# Patient Record
Sex: Male | Born: 1995 | Race: White | Hispanic: No | Marital: Single | Smoking: Former smoker
Health system: Southern US, Community
[De-identification: ages and names within clinical notes are randomized; demographics above are authoritative.]

## PROBLEM LIST (undated history)

## (undated) DIAGNOSIS — F1011 Alcohol abuse, in remission: Secondary | ICD-10-CM

## (undated) DIAGNOSIS — I10 Essential (primary) hypertension: Secondary | ICD-10-CM

## (undated) HISTORY — DX: Essential (primary) hypertension: I10

## (undated) HISTORY — DX: Alcohol abuse, in remission: F10.11

---
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
## 2017-10-15 ENCOUNTER — Ambulatory Visit: Payer: Self-pay | Admitting: *Deleted

## 2017-10-15 ENCOUNTER — Encounter: Payer: Self-pay | Admitting: *Deleted

## 2017-10-15 VITALS — BP 123/87 | HR 65 | Ht 72.0 in | Wt 205.0 lb

## 2017-10-15 DIAGNOSIS — Z0000 Encounter for general adult medical examination without abnormal findings: Secondary | ICD-10-CM

## 2017-10-15 NOTE — Progress Notes (Signed)
[facility] insurance premium discount evaluation: Labs Drawn. [facility] ROI form signed. Tobacco Free Attestation form signed.  Forms placed in paper chart.  

## 2017-10-15 NOTE — Addendum Note (Signed)
Addended by: Loraine GripWORKMAN, HALEY S on: 10/15/2017 10:20 AM   Modules accepted: Orders

## 2017-10-16 LAB — CMP12+LP+TP+TSH+6AC+CBC/D/PLT
ALT: 7 IU/L (ref 0–44)
AST: 14 IU/L (ref 0–40)
Albumin/Globulin Ratio: 1.8 (ref 1.2–2.2)
Albumin: 4.6 g/dL (ref 3.5–5.5)
Alkaline Phosphatase: 67 IU/L (ref 39–117)
BUN/Creatinine Ratio: 6 — ABNORMAL LOW (ref 9–20)
BUN: 6 mg/dL (ref 6–20)
Basophils Absolute: 0.1 10*3/uL (ref 0.0–0.2)
Basos: 1 %
Bilirubin Total: 1.1 mg/dL (ref 0.0–1.2)
CALCIUM: 9.5 mg/dL (ref 8.7–10.2)
CHOL/HDL RATIO: 2.8 ratio (ref 0.0–5.0)
CREATININE: 1 mg/dL (ref 0.76–1.27)
Chloride: 105 mmol/L (ref 96–106)
Cholesterol, Total: 113 mg/dL (ref 100–169)
EOS (ABSOLUTE): 0.1 10*3/uL (ref 0.0–0.4)
Eos: 2 %
Free Thyroxine Index: 1.9 (ref 1.2–4.9)
GFR, EST AFRICAN AMERICAN: 126 mL/min/{1.73_m2} (ref >59)
GFR, EST NON AFRICAN AMERICAN: 109 mL/min/{1.73_m2} (ref >59)
GGT: 10 IU/L (ref 0–65)
GLOBULIN, TOTAL: 2.5 g/dL (ref 1.5–4.5)
GLUCOSE: 88 mg/dL (ref 65–99)
HDL: 40 mg/dL (ref >39)
Hematocrit: 46.5 % (ref 37.5–51.0)
Hemoglobin: 16.2 g/dL (ref 13.0–17.7)
Immature Grans (Abs): 0 10*3/uL (ref 0.0–0.1)
Immature Granulocytes: 0 %
Iron: 143 ug/dL (ref 38–169)
LDH: 157 IU/L (ref 121–224)
LDL Calculated: 60 mg/dL (ref 0–109)
Lymphocytes Absolute: 2.8 10*3/uL (ref 0.7–3.1)
Lymphs: 46 %
MCH: 30.6 pg (ref 26.6–33.0)
MCHC: 34.8 g/dL (ref 31.5–35.7)
MCV: 88 fL (ref 79–97)
MONOS ABS: 0.6 10*3/uL (ref 0.1–0.9)
Monocytes: 9 %
NEUTROS ABS: 2.5 10*3/uL (ref 1.4–7.0)
Neutrophils: 42 %
PHOSPHORUS: 3.5 mg/dL (ref 2.5–5.6)
POTASSIUM: 4 mmol/L (ref 3.5–5.2)
Platelets: 244 10*3/uL (ref 150–450)
RBC: 5.29 x10E6/uL (ref 4.14–5.80)
RDW: 14 % (ref 12.3–15.4)
SODIUM: 140 mmol/L (ref 134–144)
T3 Uptake Ratio: 28 % (ref 24–39)
T4 TOTAL: 6.9 ug/dL (ref 4.5–12.0)
TSH: 2.59 u[IU]/mL (ref 0.450–4.500)
Total Protein: 7.1 g/dL (ref 6.0–8.5)
Triglycerides: 65 mg/dL (ref 0–89)
URIC ACID: 8.2 mg/dL (ref 3.7–8.6)
VLDL Cholesterol Cal: 13 mg/dL (ref 5–40)
WBC: 6.1 10*3/uL (ref 3.4–10.8)

## 2017-10-16 LAB — HGB A1C W/O EAG: HEMOGLOBIN A1C: 4.8 % (ref 4.8–5.6)

## 2017-10-19 NOTE — Progress Notes (Signed)
Results reviewed with pt 10/18/17. All labs WNL. BP noted to be elevated. Checked day of lab draw twice. 123/87 and 135/91. Rechecked at result review appt. 140/92. Pt reports caffeine and sugar intake throughout the day. Sts diet is poor and full of junk food. Craves salty food. Reports BP has been high for a few years now. Peds has just told him to improve his diet in order to correct it, but he has difficulty doing this. Sts BP is much improved from where it has been previously, 160s/100's per his report. Diet and exercise recommendations reviewed for HTN, wt management as BMI 27. HTN sx reviewed with pt. ED precautions regarding HTN reviewed.  Pt not seeing pcp at this time due to no insurance coverage. 90 day waiting period for coverage initiation with new job insurance. Also waiting for more stable financial and living arrangements in order to proceed with seeing pcp and also beginning medical gender transition male to male. Hoping to start this in the last quarter of this year. Pt reports that he has been in therapy for multiple years off and on previously but his father made him stop going a couple of years ago. Advised pt of onsite employee counseling available through [employer] and provided her contact info so he can schedule with her if needed. Pt appreciative of resources.   Copy of labs provided to pt. Results routed to pcp per pt request. No further questions/concerns.     Update 10/19/17: Email sent to pt to return to clinic for repeat BP and wt check in 3 months with link to Mediterranean diet as per NP recommendations.

## 2017-10-19 NOTE — Progress Notes (Signed)
noted 

## 2018-02-22 ENCOUNTER — Ambulatory Visit: Payer: Self-pay

## 2018-03-21 ENCOUNTER — Ambulatory Visit: Payer: Self-pay | Admitting: *Deleted

## 2018-03-21 VITALS — BP 132/100

## 2018-03-21 DIAGNOSIS — R030 Elevated blood-pressure reading, without diagnosis of hypertension: Secondary | ICD-10-CM
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
## 2018-06-20 ENCOUNTER — Ambulatory Visit: Payer: Self-pay | Admitting: *Deleted

## 2018-06-20 VITALS — BP 152/93

## 2018-06-20 DIAGNOSIS — R030 Elevated blood-pressure reading, without diagnosis of hypertension: Secondary | ICD-10-CM

## 2018-06-20 NOTE — Progress Notes (Signed)
If she eats breakfast, (20-30% of the time) usually has oatmeal. Rice and beans for lunch (70% of the time). Dinner almost 100% of time. Usually salad at home. These changes have only happened in the past 2-3 weeks as she reports she "fell out" once and felt like she would 2 other times last month and knew she needed to make a change to prevent this as she was "starving and binging" based on her food availability 2/2 finances.  BP remains increased. Informed pt that if it remains elevated at [facility] appt in 5 weeks, she will definitely need to get established with a pcp to manage this due to all of the risks associated with HTN at this young of an age. She verbalizes understanding and agreement, though is hopeful it will be improved in a few weeks.

## 2018-10-19 DIAGNOSIS — F640 Transsexualism: Secondary | ICD-10-CM | POA: Insufficient documentation

## 2018-10-19 DIAGNOSIS — Z789 Other specified health status: Secondary | ICD-10-CM | POA: Insufficient documentation

## 2019-01-04 ENCOUNTER — Encounter: Payer: Self-pay | Admitting: Registered Nurse

## 2019-01-04 ENCOUNTER — Telehealth: Payer: Self-pay | Admitting: Registered Nurse

## 2019-01-04 DIAGNOSIS — J45909 Unspecified asthma, uncomplicated: Secondary | ICD-10-CM | POA: Insufficient documentation
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
## 2019-01-05 NOTE — Telephone Encounter (Signed)
Noted will follow up with patient next week  Does not have home voicemail.  Message left at work for patient by Cooper Render regarding tetanus booster and laceration follow up.  RN Hildred Alamin working partial second shift on Tuesday next week.  Patient works second shift.

## 2019-01-05 NOTE — Telephone Encounter (Signed)
Emailed pt as she is on 2nd shift. Advised her of recommended Tdap booster. Advised to check if RN is here late when she gets in to work today. If so, will plan to complete today. If not, Tuesday 9/1 will be first time RN schedule extends to 2nd shift. Appt made for that afternoon in case pt is not seen prior to this.

## 2019-01-12 ENCOUNTER — Other Ambulatory Visit: Payer: Self-pay

## 2019-01-12 ENCOUNTER — Ambulatory Visit: Payer: Self-pay | Admitting: *Deleted

## 2019-01-12 DIAGNOSIS — Z23 Encounter for immunization: Secondary | ICD-10-CM

## 2019-01-12 NOTE — Progress Notes (Signed)
Finger lac from crystal 5 days ago. Pt last Tdap 2010. Booster administered.

## 2019-04-17 ENCOUNTER — Telehealth: Payer: Self-pay | Admitting: *Deleted

## 2019-04-17 DIAGNOSIS — Z20822 Contact with and (suspected) exposure to covid-19: Secondary | ICD-10-CM

## 2019-04-17 DIAGNOSIS — Z20828 Contact with and (suspected) exposure to other viral communicable diseases: Secondary | ICD-10-CM

## 2019-04-17 NOTE — Telephone Encounter (Signed)
RN advised by HR manager that pt had an exposure to a positive covid pt. Spoke with pt over phone and she reports that she was with a small group of people on the evening of 04/14/19. She sts one person may have had covid sx but had other health/substance use issues happening and sx were attributed to this. However the next morning, that person was taken to a healthcare facility for substance use and had a covid test performed routinely. Pt was made aware that same morning, 04/15/19 of the person's positive result. She reports that one other person from the group has also informed her of their positive test over the weekend.  Reviewed possible Covid sx with pt, eg sinusitis sx, loss of taste/smell, sore throat, cough, ShOB, fatigue, body aches, fever/chills, n/v/d. Pt denies any current personal sx. Also reviewed same day care/ER precautions of dizziness/syncope, difficulty breathing, confusion, blue tint to lips/face.   Due to exposure timeline, Day 1 of asymptomatic, positive exposure quarantine 04/15/19. Plan for testing 04/20/19, Day 6. Last day of quarantine 12/19 with eligibility to return to work onsite next workday 05/01/19. Lives in [place]. [place] [facility] testing site requested. Instructions given for drive thru testing, ie remain in vehicle, wear mask, 10a-3p arrival, anticipate longer waits. Will f/u with pt by phone on Monday 12/14 with anticipated test result, sx check. Pt to contact clinic with any changes in status/sx, any questions, or other positive results she is made aware of from group she was with. Pt verbalizes understanding and agreement with plan of care. Denies further questions/concerns.

## 2019-04-17 NOTE — Telephone Encounter (Signed)
Noted agree with plan of care 

## 2019-04-20 ENCOUNTER — Other Ambulatory Visit: Payer: Self-pay

## 2019-04-20 DIAGNOSIS — Z20822 Contact with and (suspected) exposure to covid-19: Secondary | ICD-10-CM

## 2019-04-22 ENCOUNTER — Encounter: Payer: Self-pay | Admitting: *Deleted

## 2019-04-22 LAB — NOVEL CORONAVIRUS, NAA: SARS-CoV-2, NAA: NOT DETECTED

## 2019-04-22 NOTE — Telephone Encounter (Signed)
Patient notified negative test result continue quarantine at home and monitoring for symptoms of covid e.g. runny nose, sore throat, headache, loss of taste/smell, n/v/d, fever/chills, body aches, cough, shortness of breath/dyspnea.  Patient denied any symptoms at this time.  Quarantine to end  04/29/2019 and return to work 12/21 as long as symptom free continues and no new known covid close positive contacts . Discussed with patient covid spread and incidence in community high continue to protect self with mask wear, hand sanitizing washing, avoid touching face and maintaining social distancing.  Patient to notify clinic staff if symptoms develop.  Discussed RN Hildred Alamin in clinic M, Tu, Th and Fr.  Patient verbalized understanding of information/instructions, agreed with plan of care and had no further questions at this time.  HR Tim notified of negative test result.

## 2019-04-24 NOTE — Telephone Encounter (Signed)
Noted. Negative test for Covid and pt remains asymptomatic at this time.

## 2019-04-25 NOTE — Telephone Encounter (Signed)
noted 

## 2019-04-30 NOTE — Telephone Encounter (Signed)
Please verify if patient returned to work 05/01/2019

## 2019-05-09 NOTE — Telephone Encounter (Signed)
noted 

## 2019-05-09 NOTE — Telephone Encounter (Signed)
Pt's supervisor at work confirmed that pt returned to work as planned on 05/01/2019.

## 2020-01-05 ENCOUNTER — Telehealth: Payer: Self-pay | Admitting: *Deleted

## 2020-01-05 NOTE — Telephone Encounter (Signed)
Noted agree with plan of care 

## 2020-01-05 NOTE — Telephone Encounter (Signed)
RN notified that pt called out of work this morning citing food poisoning. RN spoke with pt over phone. She reports eating [place] food with shrimp on Wed and then reheated same food on Thursday afternoon. By Thursday night, she was feeling nauseous. Woke up at 0400 today with vomiting. So far with 3 episodes of emesis, no diarrhea. Abd pain intermittent last night but none today.  Pt sts she is beginning to feel better.  Reviewed liquid and BRAT diets with pt to start when appetite returns and advance slowly as tolerated.   No concern for Covid currently but reviewed possible sx with pt and advised if any other sx start to contact clinic for re-evaluation. Pt not scheduled to work this weekend. Plans to RTW on next scheduled day Monday 8/30. Pt verbalizes understanding and agreement with plan of care. No further questions/concerns.

## 2020-01-06 NOTE — Telephone Encounter (Signed)
Patient reported he has slept all day today.  Last urination this morning.  Doesn't remember what color it was.  Last emesis yesterday.  Discussed with patient to sip clear liquids hourly today while awake may advance diet to bland tomorrow.  Bland diet includes bread, crackers, banana, pasta without sauce, mashed potatoes, rice. Avoid dairy/spicy, fried and large portions of meat while having nausea.  If vomiting hold po intake x 1 hour.  Then sips clear fluids like soup broths, ginger ale, power ade, gatorade, pedialyte may advance to soft/bland if no vomiting x 24 hours and appetite returned otherwise hydration main focus.     Return to the clinic if symptoms persist or worsen; I have alerted the patient to call if high fever, dehydration, marked weakness, fainting, increased abdominal pain, blood in stool or vomit (red or black).  Patient does not think he can get into his mychart.  Discussed he can email me at [email or web address] if further questions or concerns.  If unable to urinate every 8 hours, tea/cola colored urine, bright red blood or black/coffee ground stools I recommend he be seen for re-evaluation same day UC/ER as may need IV fluids/labs. I encouraged him to rest and hydrate tonight and tomorrow on his days off.  Patient sleepy during call as I woke him up with telephone call.  Speech clear, respirations even and unlabored oriented x 3.  Patient verbalized agreement and understanding of treatment plan and had no further questions at this time.

## 2020-01-08 NOTE — Telephone Encounter (Signed)
Attempted to call pt to f/u and ensure RTW on 2nd shift today. No answer and call disconnected prior to VM mesg. Will attempt again after 2nd shift has started.

## 2020-01-08 NOTE — Telephone Encounter (Signed)
Pt stopped by RN office asking if missed call was from me. I confirmed I had tried to contact for symptom update and ensure RTW today. She reports feeling well, denies any active sx of n/v/d, but still feels a little dehydrated. Encouraged to drink water and Gatorade/Powerade/Propel to replace electrolytes. Ensure drinking enough to urinate at least every 4 hours while awake and keep urine clear to pale yellow. Pt voices understanding. Denies any further questions/concerns.

## 2020-01-09 ENCOUNTER — Encounter: Payer: Self-pay | Admitting: *Deleted

## 2020-01-09 NOTE — Telephone Encounter (Signed)
Noted patient RTW as expected still feeling a little dehydrated and encouraged to increase noncaffeinated fluid intake by RN Rolly Salter.

## 2020-02-08 ENCOUNTER — Other Ambulatory Visit: Payer: Self-pay

## 2020-02-08 ENCOUNTER — Ambulatory Visit: Payer: Self-pay | Admitting: Registered Nurse

## 2020-02-08 ENCOUNTER — Telehealth: Payer: Self-pay | Admitting: Registered Nurse

## 2020-02-08 VITALS — BP 140/92 | HR 80

## 2020-02-08 DIAGNOSIS — R030 Elevated blood-pressure reading, without diagnosis of hypertension: Secondary | ICD-10-CM

## 2020-02-08 DIAGNOSIS — W228XXA Striking against or struck by other objects, initial encounter: Secondary | ICD-10-CM

## 2020-02-08 DIAGNOSIS — S9032XA Contusion of left foot, initial encounter: Secondary | ICD-10-CM

## 2020-02-08 DIAGNOSIS — IMO0001 Reserved for inherently not codable concepts without codable children: Secondary | ICD-10-CM

## 2020-02-08 MED ORDER — IBUPROFEN 800 MG PO TABS: 800.0000 mg | ORAL_TABLET | Freq: Three times a day (TID) | ORAL | 0 refills | Status: AC | PRN

## 2020-02-08 MED ORDER — ACETAMINOPHEN 500 MG PO TABS: 1000.0000 mg | ORAL_TABLET | Freq: Four times a day (QID) | ORAL | 0 refills | Status: AC | PRN

## 2020-02-08 NOTE — Progress Notes (Signed)
Subjective:    Patient ID: Eric Herman, adult    DOB: 16-Mar-1996, 24 y.o.   MRN: [id number]  24y/o established male pt reporting a box falling on her left lateral ankle yesterday (9/29) at work from work cart when transporting to workstation. Box approx 20# per pt. Today with pain, 2+ swelling and some bruising on initial evaluation by RN Rolly Salter prior to NP clinic. No OTCs since injury. RN gave ibuprofen 800mg  from clinic otc stock at 0930 today and wrapped ankle/foot in ace bandage as swollen laterally and patient limping today.  Injury report form received and reviewed from patient supervisor via email.  Patient reported he has injured this ankle before hiking "easily turns ankle if rock/etc"  Patient reported still has to schedule 2nd covid vaccine dose prior to 1 Nov and has noted weight gain since starting hormone therapy.  Size 12.5 shoes  [employer] 3 Nov inventory     Review of Systems  Constitutional: Negative for appetite change, chills, diaphoresis, fatigue and fever.  HENT: Negative for trouble swallowing and voice change.   Eyes: Negative for photophobia and visual disturbance.  Respiratory: Negative for cough, shortness of breath, wheezing and stridor.   Cardiovascular: Negative for chest pain.  Gastrointestinal: Negative for diarrhea, nausea and vomiting.  Endocrine: Negative for cold intolerance and heat intolerance.  Genitourinary: Negative for difficulty urinating.  Musculoskeletal: Positive for arthralgias, gait problem, joint swelling and myalgias. Negative for back pain, neck pain and neck stiffness.  Skin: Positive for color change. Negative for wound.  Allergic/Immunologic: Negative for environmental allergies and food allergies.  Neurological: Negative for dizziness, tremors, seizures, syncope, facial asymmetry, speech difficulty, weakness, light-headedness, numbness and headaches.  Hematological: Negative for adenopathy. Does not bruise/bleed easily.   Psychiatric/Behavioral: Negative for agitation, confusion and sleep disturbance.       Objective:   Physical Exam Vitals and nursing note reviewed.  Constitutional:      General: She is awake. She is not in acute distress.    Appearance: Normal appearance. She is well-developed, well-groomed and normal weight. She is not ill-appearing, toxic-appearing or diaphoretic.  HENT:     Head: Normocephalic and atraumatic.     Right Ear: Hearing and external ear normal.     Left Ear: Hearing and external ear normal.     Nose: Nose normal.     Mouth/Throat:     Pharynx: Oropharynx is clear.  Eyes:     General: Lids are normal. Vision grossly intact. Gaze aligned appropriately. No scleral icterus.       Right eye: No discharge.        Left eye: No discharge.     Extraocular Movements: Extraocular movements intact.     Conjunctiva/sclera: Conjunctivae normal.     Pupils: Pupils are equal, round, and reactive to light.  Neck:     Trachea: Trachea and phonation normal.  Cardiovascular:     Rate and Rhythm: Normal rate and regular rhythm.     Pulses: Normal pulses.  Pulmonary:     Effort: Pulmonary effort is normal. No respiratory distress.     Breath sounds: Normal breath sounds and air entry. No stridor or transmitted upper airway sounds. No wheezing.     Comments: Spoke full sentences without difficulty; no cough observed in exam room; wearing cloth mask due to covid 19 pandemic Abdominal:     Palpations: Abdomen is soft.  Musculoskeletal:        General: Swelling, tenderness and signs of injury present.  No deformity.     Right shoulder: Normal.     Right elbow: Normal.     Left elbow: Normal.     Right hand: Normal.     Left hand: Normal.     Cervical back: Normal, normal range of motion and neck supple. No swelling, edema, deformity, erythema, signs of trauma, lacerations, rigidity or crepitus. No pain with movement. Normal range of motion.     Thoracic back: Normal. No swelling,  edema, deformity, signs of trauma or lacerations.     Lumbar back: Normal. No swelling, edema, deformity, signs of trauma or lacerations.     Right hip: Normal. No deformity or lacerations. Normal range of motion.     Left hip: Normal. No deformity or lacerations. Normal range of motion.     Right knee: Normal. No swelling or deformity.     Left knee: Normal. No swelling or deformity.     Right lower leg: No edema.     Left lower leg: Tenderness present. No deformity or lacerations. 1+ Edema present.     Left ankle: Swelling and ecchymosis present. No deformity or lacerations. Tenderness present over the lateral malleolus. No medial malleolus tenderness. Decreased range of motion.     Left Achilles Tendon: Normal. No tenderness or defects.     Left foot: Normal range of motion and normal capillary refill. Tenderness present. No swelling, deformity, bunion, laceration or crepitus. Normal pulse.       Legs:     Comments: Increased temperature adjacent lateral malleolus; patient wearing vans ankle height canvas shoes slow to remove left shoe due to pain; ace bandage removed for exam and figure 8 wrap reapplied; left ankle AROM decreased due to pain  Feet:     Left foot:     Skin integrity: Callus present. No ulcer, blister, skin breakdown, erythema, warmth, dry skin or fissure.     Toenail Condition: Left toenails are normal.  Lymphadenopathy:     Head:     Right side of head: No submental or submandibular adenopathy.     Left side of head: No submental or submandibular adenopathy.     Cervical:     Right cervical: No superficial cervical adenopathy.    Left cervical: No superficial cervical adenopathy.  Skin:    General: Skin is warm and dry.     Capillary Refill: Capillary refill takes less than 2 seconds.     Coloration: Skin is not ashen, cyanotic, jaundiced, mottled, pale or sallow.     Findings: Bruising, ecchymosis and signs of injury present. No abrasion, abscess, acne, burn,  erythema, laceration, lesion, petechiae, rash or wound.     Nails: There is no clubbing.  Neurological:     General: No focal deficit present.     Mental Status: She is alert and oriented to person, place, and time. Mental status is at baseline.     GCS: GCS eye subscore is 4. GCS verbal subscore is 5. GCS motor subscore is 6.     Cranial Nerves: Cranial nerves are intact. No cranial nerve deficit, dysarthria or facial asymmetry.     Sensory: Sensation is intact. No sensory deficit.     Motor: Motor function is intact. No weakness, tremor, atrophy, abnormal muscle tone or seizure activity.     Coordination: Coordination is intact. Coordination normal.     Gait: Gait abnormal.     Comments: Gait slow and steady slight limp left noted; bilateral hand grasp equal 5/5; in/out of chair  without difficulty  Psychiatric:        Attention and Perception: Attention and perception normal.        Mood and Affect: Mood and affect normal.        Speech: Speech normal.        Behavior: Behavior normal. Behavior is cooperative.        Thought Content: Thought content normal.        Cognition and Memory: Cognition and memory normal.        Judgment: Judgment normal.   favoring left foot/limp when ambulating; no bruising noted plantar foot or superior to lateral malleolus   Dispensed knee scooter to patient from clinic supply and to return when no longer using at work/home.  RN Rolly Salter notified.  Demonstrated use to patient and fitted from clinic stock.  Patient verbalized understanding information/instructions, agreed with plan of care and had no further questions at this time.     Assessment & Plan:  A-left ankle sprain, contusion initial visit, elevated blood pressure  P-Discussed due to contract limitations I am unable to prescribe work restrictions in this clinic.  If he needs work restrictions will need to schedule him appt with [facility] provider in [place] or [place].  Given Ace wrap, ice pack,   Motrin 800mg  po TID prn pain #30 RF0 dispensed from [employer] to patient, and tylenol 1000mg  po q6h prn pain #8 UD (2 days) dispensed to patient from clinic stock.  Printed and given exitcare handouts ankle sprain, rehab exercises and contusion.  Patient was instructed to rest, ice and elevate the ankle as much as possible. Continue wear his high top shoes for ankle support or other similar footwear.   Activity as tolerated and work on ROM exercises.  Elevate foot/ankle when sitting. Patient is to take NSAIDS as needed with food to lessen risk of gastritis.  Tylenol first choice and if no relief than motrin. Discussed at risk to reinjure ankle over the next year and to wear supportive footwear/ankle sleeve/ace bandage. Follow up in 2 weeks for re-evaluation.  Sooner if new symptoms/worsening despite plan of care. Patient verbalized agreement and  understanding of treatment plan and had no further questions at this time. P2: [employer].  Elevated blood pressure due to acute pain.  Recommended recheck when pain resolving with RN .  Patient verbalized understanding information/instructions and had no further questions at this time.

## 2020-02-08 NOTE — Telephone Encounter (Signed)
See office visit note

## 2020-02-08 NOTE — Telephone Encounter (Signed)
Noted patient scheduled for 1130 today.

## 2020-02-08 NOTE — Telephone Encounter (Signed)
Pt scheduled. Saw by RN this morning and wrapped in Ace bandage until appt.

## 2020-02-08 NOTE — Telephone Encounter (Signed)
Notified by [employer] officer patient injured ankle at work yesterday and needs evaluation today as pain worsening during shift yesterday.  Box fell off cart onto foot/ankle yesterday 02/07/2020 at work.

## 2020-02-08 NOTE — Patient Instructions (Addendum)
Ankle Sprain, Phase I Rehab An ankle sprain is an injury to the ligaments of your ankle. Ankle sprains cause stiffness, loss of motion, and loss of strength. Ask your health care provider which exercises are safe for you. Do exercises exactly as told by your health care provider and adjust them as directed. It is normal to feel mild stretching, pulling, tightness, or discomfort as you do these exercises. Stop right away if you feel sudden pain or your pain gets worse. Do not begin these exercises until told by your health care provider. Stretching and range-of-motion exercises These exercises warm up your muscles and joints and improve the movement and flexibility of your lower leg and ankle. These exercises also help to relieve pain and stiffness. Gastroc and soleus stretch This exercise is also called a calf stretch. It stretches the muscles in the back of the lower leg. These muscles are the gastrocnemius, or gastroc, and the soleus. 1. Sit on the floor with your left / right leg extended. 2. Loop a belt or towel around the ball of your left / right foot. The ball of your foot is on the walking surface, right under your toes. 3. Keep your left / right ankle and foot relaxed and keep your knee straight while you use the belt or towel to pull your foot toward you. You should feel a gentle stretch behind your calf or knee in your gastroc muscle. 4. Hold this position for ___15_______ seconds, then release to the starting position. 5. Repeat the exercise with your knee bent. You can put a pillow or a rolled bath towel under your knee to support it. You should feel a stretch deep in your calf in the soleus muscle or at your Achilles tendon. Repeat ____3______ times. Complete this exercise ____3______ times a day. Ankle alphabet  1. Sit with your left / right leg supported at the lower leg. ? Do not rest your foot on anything. ? Make sure your foot has room to move freely. 2. Think of your left / right  foot as a paintbrush. ? Move your foot to trace each letter of the alphabet in the air. Keep your hip and knee still while you trace. ? Make the letters as large as you can without feeling discomfort. 3. Trace every letter from A to Z. Repeat ____3______ times. Complete this exercise ____3______ times a day. Strengthening exercises These exercises build strength and endurance in your ankle and lower leg. Endurance is the ability to use your muscles for a long time, even after they get tired. Ankle dorsiflexion  1. Secure a rubber exercise band or tube to an object, such as a table leg, that will stay still when the band is pulled. Secure the other end around your left / right foot. 2. Sit on the floor facing the object, with your left / right leg extended. The band or tube should be slightly tense when your foot is relaxed. 3. Slowly bring your foot toward you, bringing the top of your foot toward your shin (dorsiflexion), and pulling the band tighter. 4. Hold this position for _____15_____ seconds. 5. Slowly return your foot to the starting position. Repeat ___3_______ times. Complete this exercise _____3_____ times a day. Ankle plantar flexion  1. Sit on the floor with your left / right leg extended. 2. Loop a rubber exercise tube or band around the ball of your left / right foot. The ball of your foot is on the walking surface, right under your  toes. ? Hold the ends of the band or tube in your hands. ? The band or tube should be slightly tense when your foot is relaxed. 3. Slowly point your foot and toes downward to tilt the top of your foot away from your shin (plantar flexion). 4. Hold this position for __15________ seconds. 5. Slowly return your foot to the starting position. Repeat ____3______ times. Complete this exercise _____3_____ times a day. Ankle eversion 1. Sit on the floor with your legs straight out in front of you. 2. Loop a rubber exercise band or tube around the ball of  your left / right foot. The ball of your foot is on the walking surface, right under your toes. ? Hold the ends of the band in your hands, or secure the band to a stable object. ? The band or tube should be slightly tense when your foot is relaxed. 3. Slowly push your foot outward, away from your other leg (eversion). 4. Hold this position for ___15_______ seconds. 5. Slowly return your foot to the starting position. Repeat _____3_____ times. Complete this exercise _____3_____ times a day. This information is not intended to replace advice given to you by your health care provider. Make sure you discuss any questions you have with your health care provider. Document Revised: 08/16/2018 Document Reviewed: 02/07/2018 Elsevier Patient Education  2020 [employer] Ankle Sprain  An ankle sprain is a stretch or tear in a ligament in the ankle. Ligaments are tissues that connect bones to each other. The two most common types of ankle sprains are:  Inversion sprain. This happens when the foot turns inward and the ankle rolls outward. It affects the ligament on the outside of the foot (lateral ligament).  Eversion sprain. This happens when the foot turns outward and the ankle rolls inward. It affects the ligament on the inner side of the foot (medial ligament). What are the causes? This condition is often caused by accidentally rolling or twisting the ankle. What increases the risk? You are more likely to develop this condition if you play sports. What are the signs or symptoms? Symptoms of this condition include:  Pain in your ankle.  Swelling.  Bruising. This may develop right after you sprain your ankle or 1-2 days later.  Trouble standing or walking, especially when you turn or change directions. How is this diagnosed? This condition is diagnosed with:  A physical exam. During the exam, your health care provider will press on certain parts of your foot and ankle and try to move them  in certain ways.  X-ray imaging. These may be taken to see how severe the sprain is and to check for broken bones. How is this treated? This condition may be treated with:  A brace or splint. This is used to keep the ankle from moving until it heals.  An elastic bandage. This is used to support the ankle.  Crutches.  Pain medicine.  Surgery. This may be needed if the sprain is severe.  Physical therapy. This may help to improve the range of motion in the ankle. Follow these instructions at home: If you have a brace or a splint:  Wear the brace or splint as told by your health care provider. Remove it only as told by your health care provider.  Loosen the brace or splint if your toes tingle, become numb, or turn cold and blue.  Keep the brace or splint clean.  If the brace or splint is not waterproof: ? Do  not let it get wet. ? Cover it with a watertight covering when you take a bath or a shower. If you have an elastic bandage (dressing):  Remove it to shower or bathe.  Try not to move your ankle much, but wiggle your toes from time to time. This helps to prevent swelling.  Adjust the dressing to make it more comfortable if it feels too tight.  Loosen the dressing if you have numbness or tingling in your foot, or if your foot becomes cold and blue. Managing pain, stiffness, and swelling   Take over-the-counter and prescription medicines only as told by your health care provider.  For 2-3 days, keep your ankle raised (elevated) above the level of your heart as much as possible.  If directed, put ice on the injured area: ? If you have a removable brace or splint, remove it as told by your health care provider. ? Put ice in a plastic bag. ? Place a towel between your skin and the bag. ? Leave the ice on for 20 minutes, 2-3 times a day. General instructions  Rest your ankle.  Do not use the injured limb to support your body weight until your health care provider says  that you can. Use crutches as told by your health care provider.  Do not use any products that contain nicotine or tobacco, such as cigarettes, e-cigarettes, and chewing tobacco. If you need help quitting, ask your health care provider.  Keep all follow-up visits as told by your health care provider. This is important. Contact a health care provider if:  You have rapidly increasing bruising or swelling.  Your pain is not relieved with medicine. Get help right away if:  Your foot or toes become numb or blue.  You have severe pain that gets worse. Summary  An ankle sprain is a stretch or tear in a ligament in the ankle. Ligaments are tissues that connect bones to each other.  This condition is often caused by accidentally rolling or twisting the ankle.  Symptoms include pain, swelling, bruising, and trouble walking.  To relieve pain and swelling, put ice on the affected ankle, raise your ankle above the level of your heart, and use an elastic bandage.  Keep all follow-up visits as told by your health care provider. This is important. This information is not intended to replace advice given to you by your health care provider. Make sure you discuss any questions you have with your health care provider. Document Revised: 01/17/2018 Document Reviewed: 09/21/2017 Elsevier Patient Education  2020 [employer] Contusion A contusion is a deep bruise. Contusions are the result of a blunt injury to tissues and muscle fibers under the skin. The injury causes bleeding under the skin. The skin overlying the contusion may turn blue, purple, or yellow. Minor injuries will give you a painless contusion, but more severe injuries cause contusions that may stay painful and swollen for a few weeks. Follow these instructions at home: Pay attention to any changes in your symptoms. Let your health care provider know about them. Take these actions to relieve your pain. Managing pain, stiffness, and  swelling   Use resting, icing, applying pressure (compression), and raising (elevating) the injured area. This is often called the RICE strategy. ? Rest the injured area. Return to your normal activities as told by your health care provider. Ask your health care provider what activities are safe for you. ? If directed, put ice on the injured area:  Put ice in  a plastic bag.  Place a towel between your skin and the bag.  Leave the ice on for 20 minutes, 2-3 times per day. ? If directed, apply light compression to the injured area using an elastic bandage. Make sure the bandage is not wrapped too tightly. Remove and reapply the bandage as directed by your health care provider. ? If possible, raise (elevate) the injured area above the level of your heart while you are sitting or lying down. General instructions  Take over-the-counter and prescription medicines only as told by your health care provider.  Keep all follow-up visits as told by your health care provider. This is important. Contact a health care provider if:  Your symptoms do not improve after several days of treatment.  Your symptoms get worse.  You have difficulty moving the injured area. Get help right away if:  You have severe pain.  You have numbness in a hand or foot.  Your hand or foot turns pale or cold. Summary  A contusion is a deep bruise.  Contusions are the result of a blunt injury to tissues and muscle fibers under the skin.  It is treated with rest, ice, compression, and elevation. You may be given over-the-counter medicines for pain.  Contact a health care provider if your symptoms do not improve, or get worse.  Get help right away if you have severe pain, have numbness, or the area turns pale or cold. This information is not intended to replace advice given to you by your health care provider. Make sure you discuss any questions you have with your health care provider. Document Revised: 12/16/2017  Document Reviewed: 12/16/2017 Elsevier Patient Education  2020 [employer].

## 2020-02-13 ENCOUNTER — Other Ambulatory Visit: Payer: Self-pay | Admitting: Family Medicine

## 2020-02-13 ENCOUNTER — Other Ambulatory Visit: Payer: Self-pay

## 2020-02-13 ENCOUNTER — Encounter: Payer: Self-pay | Admitting: Registered Nurse

## 2020-02-13 ENCOUNTER — Ambulatory Visit: Payer: Self-pay | Admitting: Registered Nurse

## 2020-02-13 ENCOUNTER — Ambulatory Visit: Payer: Self-pay

## 2020-02-13 VITALS — BP 137/102 | HR 78 | Temp 98.0°F

## 2020-02-13 DIAGNOSIS — M25572 Pain in left ankle and joints of left foot: Secondary | ICD-10-CM

## 2020-02-13 DIAGNOSIS — R030 Elevated blood-pressure reading, without diagnosis of hypertension: Secondary | ICD-10-CM

## 2020-02-13 DIAGNOSIS — W228XXD Striking against or struck by other objects, subsequent encounter: Secondary | ICD-10-CM

## 2020-02-13 DIAGNOSIS — M25472 Effusion, left ankle: Secondary | ICD-10-CM

## 2020-02-13 NOTE — Progress Notes (Signed)
Subjective:    Patient ID: Eric Herman, adult    DOB: 1995/09/01, 24 y.o.   MRN: [id number]  24y/o Caucasian male pt c/o continued L ankle pain after a box fell on side of ankle last week, 9/29. She has been using ice at least once a day at home, sometimes twice. Was wearing ace bandage and changed to compression socks when bandage would not stay up. Pain has extended from lateral ankle up into calf and down along lateral foot. Patient reported Friday 02/09/2020 pain 10/10 at work and today 3/10 "I have been using my scooter except to come down the stairs once today that is why pain better"  Wearing compression socks knee high today with VANS ankle high shoes again for ankle support.  Taking tylenol every 6 hours and motrin 1-2 times per day.  Denied worsening of bruising or swelling.  Friday swelling resolved after 36 hours of elevation at home per patient.     Review of Systems  Constitutional: Positive for activity change. Negative for appetite change, chills, diaphoresis, fatigue and fever.  HENT: Negative for trouble swallowing and voice change.   Eyes: Negative for photophobia and visual disturbance.  Respiratory: Negative for shortness of breath, wheezing and stridor.   Cardiovascular: Negative for leg swelling.  Gastrointestinal: Negative for diarrhea, nausea and vomiting.  Endocrine: Negative for cold intolerance and heat intolerance.  Genitourinary: Negative for difficulty urinating.  Musculoskeletal: Positive for gait problem, joint swelling and myalgias. Negative for arthralgias, back pain, neck pain and neck stiffness.  Skin: Negative for color change and rash.  Allergic/Immunologic: Negative for environmental allergies and food allergies.  Neurological: Negative for dizziness, tremors, seizures, syncope, facial asymmetry, speech difficulty, weakness, light-headedness, numbness and headaches.  Hematological: Negative for adenopathy. Does not bruise/bleed easily.   Psychiatric/Behavioral: Negative for agitation, confusion and sleep disturbance.       Objective:   Physical Exam Vitals and nursing note reviewed.  Constitutional:      General: She is awake. She is not in acute distress.    Appearance: Normal appearance. She is well-developed and well-groomed. She is not ill-appearing, toxic-appearing or diaphoretic.  HENT:     Head: Normocephalic and atraumatic.     Right Ear: External ear normal.     Left Ear: External ear normal.     Nose: Nose normal.     Mouth/Throat:     Pharynx: Oropharynx is clear.  Eyes:     General: No scleral icterus.       Right eye: No discharge.        Left eye: No discharge.     Extraocular Movements: Extraocular movements intact.     Conjunctiva/sclera: Conjunctivae normal.     Pupils: Pupils are equal, round, and reactive to light.  Cardiovascular:     Rate and Rhythm: Normal rate and regular rhythm.     Pulses: Normal pulses.          Dorsalis pedis pulses are 2+ on the right side.  Pulmonary:     Effort: Pulmonary effort is normal. No respiratory distress.     Breath sounds: Normal breath sounds and air entry. No stridor or transmitted upper airway sounds. No wheezing.     Comments: Wearing cloth mask due to covid 19 pandemic; spoke full sentences without difficulty; no cough observed in clinic Abdominal:     General: Abdomen is flat.     Palpations: Abdomen is soft.  Musculoskeletal:        General: Swelling,  tenderness and signs of injury present. No deformity.     Cervical back: Normal range of motion and neck supple. No rigidity.     Right lower leg: No edema.     Left lower leg: No edema.     Left foot: Decreased range of motion. Normal capillary refill. Swelling, tenderness and bony tenderness present. No laceration or crepitus. Normal pulse.     Comments: Distal to lateral malleolus 0-1+/4 nonpitting edema and faint ecchymosis yellow green; medial/lateral malleous TTP soft tissue and bone no  crepitus or loose body/defect noted/palpated/no grinding;  Patient slow to remove shoe and knee high compression sock due to pain with movement of ankle; gait quick on knee scooter in hallway switched out unit due to front wheel connection loose with steering with another clinic unit today; decreased lateral/medial deviation due to pain and flexion/extension painful decreased AROM left ankle  Skin:    General: Skin is warm and dry.     Capillary Refill: Capillary refill takes less than 2 seconds.     Coloration: Skin is not ashen, cyanotic, jaundiced, mottled, pale or sallow.     Findings: Bruising, ecchymosis and signs of injury present. No abrasion, abscess, acne, burn, erythema, laceration, lesion, petechiae, rash or wound.     Nails: There is no clubbing.  Neurological:     General: No focal deficit present.     Mental Status: She is alert and oriented to person, place, and time. Mental status is at baseline.     GCS: GCS eye subscore is 4. GCS verbal subscore is 5. GCS motor subscore is 6.     Cranial Nerves: Cranial nerves are intact. No cranial nerve deficit, dysarthria or facial asymmetry.     Sensory: Sensation is intact. No sensory deficit.     Motor: Motor function is intact. No weakness, tremor, atrophy, abnormal muscle tone or seizure activity.     Coordination: Coordination is intact. Coordination normal.     Gait: Gait abnormal.     Comments: Using knee scooter/ambulation without scooter not observed today due to pain; in/out of chair without difficulty; bilateral hand grasp equal 5/5  Psychiatric:        Attention and Perception: Attention and perception normal.        Mood and Affect: Mood and affect normal.        Speech: Speech normal.        Behavior: Behavior normal. Behavior is cooperative.        Thought Content: Thought content normal.        Cognition and Memory: Cognition and memory normal.        Judgment: Judgment normal.           Assessment & Plan:   A-elevated blood pressure without hypertension diagnosis, left ankle pain acute subsequent visit, hit by object subsequent encounter   P-Worsening pain with distal tib/fib squeeze and weight bearing ambulation.  Discussed pain probably due to partial tear or possible avulsion fracture but can not rule out/in without xray.  Ecchymosis and swelling improved today.  Discussed due to contract limitations I am unable to prescribe work restrictions in this clinic and appt scheduled with [facility] provider in [place] at 1500 today as [place] ankle rules recommend imaging also and not available at [employer].  Patient has telehealth visit scheduled at 1300 today and did not want [facility] appt prior to 1500.  Continue compression, ice when sitting/at lunch/at home 15 minutes QID/prn swelling.  Tylenol 1000mg  po q6h  prn pain and supplement with motrin/ibuprofen 800mg  po every 8 hours breakthrough pain take with food.  Discussed motrin can elevated/worsen blood pressure but tylenol does not.  Exitcare handout RICE.  Continue to use knee scooter at work and home.  Patient was instructed to rest, ice and elevate the ankle as much as possible. Continue wear his high top shoes for ankle support or other similar footwear.   Activity as tolerated and work on ROM exercises per previous .  Elevate foot/ankle when sitting. At risk to reinjure ankle over the next year and to wear supportive footwear/ankle sleeve/ace bandage. Follow up prn. Patient verbalized agreement and understanding of treatment plan and had no further questions at this time. P2: Injury Prevention and Fitness.  Elevated blood pressure due to acute pain.  Recommended recheck when pain resolving with RN [employer].  Patient verbalized understanding information/instructions and had no further questions at this time.

## 2020-02-13 NOTE — Patient Instructions (Signed)
Elastic Bandage and RICE Therapy  Elastic bandages come in different shapes and sizes. They generally provide support to your injury and reduce swelling while you are healing, but they can perform different functions. Your health care provider will help you to decide what is best for your protection, recovery, or rehabilitation after an injury. The routine care of many injuries includes rest, ice, compression, and elevation (RICE therapy). RICE therapy is often recommended for injuries to soft tissues, such as muscle strain, sprains, bruises, and overuse injuries. It can also be used for some bone injuries. Using RICE therapy can help to relieve pain and lessen swelling. What are some general tips for using an elastic bandage?  Use the bandage as directed by the maker of the bandage that you are using.  Do not wrap the bandage too tightly. This may block (cut off) the circulation in the arm or leg in the area below the bandage. ? If part of your body beyond the bandage becomes blue, numb, cold, swollen, or more painful, your bandage is probably too tight. If this occurs, remove your bandage and reapply it more loosely.  Remove and reapply an elastic bandage every 3-4 hours or as told by your health care provider.  See your health care provider if the bandage seems to be making your problems worse rather than better. How to care for your injury with RICE therapy Rest Rest your injury. This may help with the healing process. Rest usually involves limiting your normal activities and not using the injured part of your body. Generally, you can return to your normal activities when your health care provider says it is okay and you can do them without much discomfort. If you rest the injury too much, it may not heal as well. Some injuries heal better with early movement instead of resting for too long. Talk with your health care provider about how you should limit your activities and whether you should  start range-of-motion exercises for your injury. Ice Ice your injury to lessen swelling and pain. Do not apply ice directly to your skin.  Put ice in a plastic bag.  Place a towel between your skin and the bag.  Leave the ice on for 20 minutes, 2-3 times a day. Use ice on as many days as told by your health care provider.  Compression Put pressure (compression) on your injured area to control swelling, give support, and help with discomfort. Compression may be done with an elastic bandage. Elevation Raise (elevate) your injured area to lessen swelling and pain. If possible, elevate your injured area at or above the level of your heart or the center of your chest. Contact a health care provider if:  Your pain and swelling continue.  Your symptoms are getting worse rather than improving. Having these problems may mean that you need further evaluation or imaging tests, such as X-rays or an MRI. Sometimes, X-rays may not show a small broken bone (fracture) until days after the injury happened. Make a follow-up appointment with your health care provider. Ask your health care provider, or the department that is doing the imaging test, when your results will be ready. Get help right away if:  You have sudden severe pain at or below the area of your injury.  You have redness or increased swelling around your injury.  You have tingling or numbness at or below the area of your injury and it does not improve after you remove the elastic bandage. Summary  Elastic  bandages provide support to your injury and reduce swelling while you are healing. Your health care provider will help you decide which type of elastic bandage is best for your injury.  Do not wrap the bandage too tightly. This may block (cut off) the circulation in the arm or leg in the area below the bandage.  Putting pressure (compression) on your injured area with an elastic bandage is part of RICE therapy. RICE therapy includes  rest, ice, compression, and elevation. This treatment is recommended for the routine care of many injuries. This information is not intended to replace advice given to you by your health care provider. Make sure you discuss any questions you have with your health care provider. Document Revised: 01/15/2017 Document Reviewed: 01/15/2017 Elsevier Patient Education  2020 [employer].

## 2020-02-13 NOTE — Progress Notes (Signed)
Pt provided updated medication list by email. Updated now in [facility].

## 2020-02-13 NOTE — Progress Notes (Signed)
Pt in to clinic after appt to notify RN of med changes that occurred during virtual pcp visit today. Added Progesterone, unsure of dosage. Increased estradiol, same 2mg  dose, to 4x/day from 3. Spironolactone unchanged. Added antidepressant, unsure of name. Will update RN on meds and dosages once picked up from pharmacy.

## 2020-02-13 NOTE — Telephone Encounter (Signed)
See encounter note 02/08/2020 and today 02/13/2020 referred to [facility] RN Rolly Salter called to schedule appt.

## 2020-02-15 NOTE — Telephone Encounter (Signed)
Patient seen at [facility] 02/12/2020 and seated duty x 2 weeks, lace up ASO, crutches, xray negative per epic review and note reviewed in [facility] patient seen by NP Parker/Dr Theresia Lo and follow up in 2 weeks continue tylenol/motrin.

## 2020-02-20 ENCOUNTER — Ambulatory Visit: Payer: Self-pay | Admitting: *Deleted

## 2020-02-20 ENCOUNTER — Other Ambulatory Visit: Payer: Self-pay

## 2020-02-20 VITALS — BP 168/120

## 2020-02-20 DIAGNOSIS — R030 Elevated blood-pressure reading, without diagnosis of hypertension: Secondary | ICD-10-CM

## 2020-02-20 NOTE — Telephone Encounter (Signed)
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  Using knee scooter at work and leaving in friend car trunk.  Crutches if prolonged walking outside of work but prefers not to use on stairs feels unsteady/concerned about slipping/falling on stairs.  Patient denied concerns at this time.

## 2020-02-20 NOTE — Progress Notes (Signed)
BP check after HA on Friday that pt felt was r/t elevated BP. BP remains elevated today. Encounter routed to primary for their recommendations. She is not due for next scheduled f/u until December 2020.   Previous BPs since being seen in employee health and wellness clinic: 10/15/17  123/87 03/21/18 132/100  06/20/18 152/93 10/19/18 142/84 (external office records; not at work) 02/08/20 140/92 02/13/20 137/102 02/20/20 168/120   Dr Clelia Croft, Please advise pt or myself if you would like to see her in clinic earlier than her scheduled Dec f/u. Thank you. Nance Pew RN [phone] F. [phone]

## 2020-02-28 ENCOUNTER — Telehealth: Payer: Self-pay | Admitting: Registered Nurse

## 2020-02-28 ENCOUNTER — Encounter: Payer: Self-pay | Admitting: Registered Nurse

## 2020-02-28 NOTE — Telephone Encounter (Signed)
Patient contacted via telephone audio call only 20 minutes.  Patient reported she stayed home after [place] notified her of fever/sick.  Patient confirmed has only received 1 pfizer vaccine in April 2021 from [place] doesn't know lot number or exact date was given at employer worksite and then no car so hasn't gone to get second dose.  She reported has been feeling dehydrated and BP was up 20 pts last week at work.  HA today forehead and soft spot by eyes has decreased caffeine intake but not salt as doritos/crackers (animals/saltines with peanut butter) or peanut butter cookies her favorite snack.  Discussed importance of fruits and vegetables in diet and that magnesium/potassium help to regulate blood pressure/lower it. Discussed don't stop caffeine cold [place] but do decrease amount (serving size/frequency)  Discussed blood pressure probably better at home then at work.  Consider home BP cuff measurements with automated machine.  RN Rolly Salter sent Dr Clelia Croft message about her blood pressures last week but she hasn't received a call or message from [facility] office.  Epic reviewed and no correspondence noted.  Discussed with patient to call Dr Clelia Croft office in am and RN Rolly Salter will follow up with them if patient is not able to get any clarification regarding plan of care/appt and to discuss how EHW staff can assist [facility] office with labs/BP rechecks/log.  Patient to continue quarantine at home until known if Shanda Bumps covid test positive (coworker/drives her to work).  Patient reported stopped smoking a few months ago and heartrate decreased, breathing better and blood pressures were better with fewer headaches.  She thinks blood pressure went back up after starting venlafaxine noted shortly after starting medication hot flash, vision blurry and BP 175 systolic cannot remember diastolic.  Patient reported on venlafaxine for depression.  Reviewed up to date adverse effects for venlafaxine with patient and only positive side effects  noted in this note per patient.  Reported vision improved but still not back to baseline and sometimes with abdomen pain intermittent.  Patient denied N/V/D/fever/chills/sore throat/rhinitis/loss of taste/smell/body aches/cough/dyspnea today.  Discussed if any of these symptoms do start to notify clinic staff as will schedule covid testing.  Discussed [place] for elevated blood pressure e.g. chest pain, shortness of breath at rest, visual changes that don't improve with rest or worst headache of life needs evaluation by provider same day preferably ER.  Patient verbalized understanding information/instructions, agreed with plan of care and had no further questions at this time.

## 2020-02-28 NOTE — Telephone Encounter (Signed)
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  Male coworker with sore throat, headache, body aches and fever, nasal congestion.  Being treated for strep throat but can not rule out coinfection flu/covid.  Covid testing scheduled tomorrow for coworker sx started 02/26/2020 pm.  Coworker stated she notified patient that she developed fever/sick via telephone.  Patient last in car 02/27/2020 with coworker.  Patient fully covid vaccinated.  If coworker has positive covid test patient will be scheduled for covid testing day 7 post exposure.  Patient to email me at [email or web address] today if symptoms or no symptoms and if symptoms to go home and start quarantine.    Reviewed possible Covid sx including cough, shortness of breath with exertion or at rest, runny nose, congestion, sinus pain/pressure, sore throat, fever/chills, body aches, fatigue, loss of taste/smell, GI symptoms of nausea/vomiting/diarrhea   Wear mask when around others out in public and frequently was hands/hand sanitize for next 12 days.  HR staff [employer] notified message left for patient awaiting return call/email to verify if symptom positive/negative.

## 2020-02-29 NOTE — Telephone Encounter (Signed)
RN Rolly Salter spoke with patient via telephone for update 02/29/2020 and rescheduled worker's comp appt for next week for her re-evaluation.

## 2020-03-01 NOTE — Telephone Encounter (Signed)
Noted still asymptomatic continuing quarantine

## 2020-03-01 NOTE — Telephone Encounter (Signed)
RN spoke with pt by phone yesterday to confirm new [facility] clinic appt on 03/04/20 at 1530. She confirmed appt date and time. Also denied any sx development while in quarantine while awaiting coworker's covid test results that are expected this weekend.

## 2020-03-02 NOTE — Telephone Encounter (Signed)
Coworker covid test results negative. As long as patient still asymptomatic may return to work will call later today to discuss with patient and HR.

## 2020-03-03 NOTE — Telephone Encounter (Signed)
Patient contacted via telephone 03/02/2020 at 0954.  She remained asymptomatic and coworker test negative cleared to return to work.  Patient reported does not work weekends and plans to return on Monday 03/04/2020.  Patient verbalized understanding information/instructions and had no further questions at this time. HR Tim/Tonya notified patient cleared to RTW.  Please verify patient RTW as expected.

## 2020-03-05 ENCOUNTER — Ambulatory Visit: Payer: Self-pay | Admitting: *Deleted

## 2020-03-05 ENCOUNTER — Other Ambulatory Visit: Payer: Self-pay

## 2020-03-05 VITALS — BP 143/98 | HR 90

## 2020-03-05 DIAGNOSIS — R030 Elevated blood-pressure reading, without diagnosis of hypertension: Secondary | ICD-10-CM

## 2020-03-05 NOTE — Progress Notes (Signed)
Routine BP check °

## 2020-03-05 NOTE — Telephone Encounter (Signed)
Patient came to clinic today for BP check.  Noted missed Worker's Comp appt yesterday due to no ride.  Patient Worker's Comp appt rescheduled for tomorrow and discussed with HR Tim patient may require ride to appt as no vehicle and on restrictions currently.  Patient no longer using knee scooter at work and returned assistive device to clinic Scientist, physiological)  RN Henderson notified of above.

## 2020-03-12 ENCOUNTER — Telehealth: Payer: Self-pay | Admitting: *Deleted

## 2020-03-12 DIAGNOSIS — R12 Heartburn: Secondary | ICD-10-CM

## 2020-03-12 NOTE — Telephone Encounter (Signed)
Patient seen at [employer] and cleared to return to full duty.  Patient returned to work today after having nausea yesterday symptom resolved.  Second covid dose scheduled for Friday 4 Nov and patient to use Benedetto Goad (given telephone number).

## 2020-03-12 NOTE — Telephone Encounter (Signed)
RN spoke with pt yesterday 03/11/20 as pt had called out of work. She reported that Sunday evening 10/31 she had n/v and syncope just after one episode of emesis. She was still tired and within the 24 hours period of vomiting which is why she called out. Was still having nausea as well yesterday. She was planning on RTW today 11/2 as she had been tolerating fluids and solids so far.  RN notified that pt had called out of work again today 11/2.  Attempted to contact pt by phone, no answer. Temporary number given yesterday as her phone had broken when she passed out. [phone]. Her listed number went straight to VM today.   Pt also needs to complete 2nd dose of Pfizer covid vaccine as her first was in March 2021. She reported to HR that a ride there is difficult to obtain. RN located resources for free rides through [employer] and will discuss with pt and help set up if permitted once pt returns phone call.

## 2020-03-13 MED ORDER — OMEPRAZOLE 20 MG PO CPDR: 20.0000 mg | DELAYED_RELEASE_CAPSULE | Freq: Every day | ORAL | 0 refills | Status: AC

## 2020-03-13 NOTE — Telephone Encounter (Signed)
Notified by Tim HR patient called out sick again today for GI upset.  Patient contacted via telephone and reported had panic attack this am 0200 and threw up.  Has been taking tums prn heartburn.  Has appt scheduled with her therapist tomorrow.  Has friends she can talk to.  Stressed about work and home at this time.   I have recommended clear fluids and bland diet.  Avoid dairy/spicy, fried and large portions of meat while having nausea.  If vomiting hold po intake x 1 hour.  Then sips clear fluids like broths, ginger ale, power ade, gatorade, pedialyte may advance to soft/bland if no vomiting x 24 hours and appetite returned otherwise hydration main focus.  Has ondonsetron/ zofran 4mg  po BID prn n/v at home for prn use.  Notify clinic staff if symptoms persist or worsen; I have alerted the patient to call if high fever, dehydration, marked weakness, fainting, increased abdominal pain, blood in stool or vomit (red or black).   Patient reported hasn't urinated yet this morning.  Discussed goal to urinate at least every 8 hours pale yellow urine clear.  If cola/tea/coffee colored recommend same day evaluation by a provider as severe dehydration/kidney problems likely.  Patient to come to clinic to pick up omeprazole 20mg  DR po daily with food #30 RF0 when back onsite.  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    Coffee/Tea are diuretic recommended gatorade/nondairy popsicles/ginger ale/broths first line if possible or alternating tea/water.   Patient verbalized agreement and understanding of treatment plan and had no further questions at this time.   HR Team notified personal medical today and cleared to return to work as long as no further GI upset today or new symptoms.  No covid testing at this time.

## 2020-03-14 NOTE — Telephone Encounter (Signed)
Pt called out of work again today due to vomiting recurring last night. She reports one more episode at 2200-2300 last night, again in the setting of a panic attack. Nausea continues today. She has not tried the Zofran that she has at home yet. Encouraged her to take this. Feels exhausted and says "it's all just a lot." Has telehealth therapist appt scheduled this afternoon that she plans to keep. She also feels dehydrated. Has been sipping apple juice and water today.  Omeprazole filled in clinic. She reports the person that she would normally ask to pick it up has left early today but if she couldn't come to work tomorrow, she would have them pick it up tomorrow.  No new symptoms per pt. Calling to report recurrent sx and if testing will be needed even though she is attributing sx to panic attacks.

## 2020-03-15 NOTE — Telephone Encounter (Signed)
Noted case discussed with RN Rolly Salter in clinic 03/14/2020; agree with plan of care.

## 2020-03-18 NOTE — Telephone Encounter (Signed)
Pt back at work today, 11/8. Reported no recent emesis but still not feeling that well. Asking about retrodating FMLA paperwork for these recent episodes of panic attacks and emesis as she is concerned about her attendance. Advised pt of HR contact that manages FMLA and that RN does not have the knowledge of the company's policy. Pt verbalizes understanding and agreement. No further questions.

## 2020-03-19 NOTE — Telephone Encounter (Signed)
Noted contract limitations prohibit FMLA completion/paperwork in this clinic.  Patient RTW yesterday and today.

## 2020-03-22 NOTE — Telephone Encounter (Signed)
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

## 2020-03-25 ENCOUNTER — Ambulatory Visit: Payer: Self-pay | Admitting: *Deleted

## 2020-03-25 ENCOUNTER — Other Ambulatory Visit: Payer: Self-pay

## 2020-03-25 VITALS — BP 147/108 | HR 100 | Wt 236.0 lb

## 2020-03-25 DIAGNOSIS — R030 Elevated blood-pressure reading, without diagnosis of hypertension: Secondary | ICD-10-CM

## 2020-04-11 ENCOUNTER — Telehealth: Payer: Self-pay | Admitting: *Deleted

## 2020-04-11 ENCOUNTER — Other Ambulatory Visit: Payer: Self-pay

## 2020-04-11 ENCOUNTER — Ambulatory Visit: Payer: Self-pay | Admitting: *Deleted

## 2020-04-11 VITALS — BP 162/110

## 2020-04-11 DIAGNOSIS — R030 Elevated blood-pressure reading, without diagnosis of hypertension: Secondary | ICD-10-CM

## 2020-04-11 NOTE — Progress Notes (Signed)
Routine BP check per pt request. 

## 2020-04-11 NOTE — Telephone Encounter (Signed)
Late entry: Pt in to clinic requesting assistance scheduling an EEG. When asked why she felt she needs this, she reported episodes of "feeling like there is a knot in the back of my neck that will all of a sudden let go and then I fall forward. I also have tremors and have noticed episodes of memory loss." Reports these have been intermittent for "years." Advised pt that neurology handles EEGs and would require an office visit before ordering any procedures. Neuro also requires referrals. Discussed case with NP Inetta Fermo and she agreed that referral would be best to come from pcp vs occ health. Advised pt to discuss sx with pcp at next routine visit in mid-December. Also encouraged pt to start keeping a log of her sx and record any details surrounding them including time of day, recent dietary intake, associated sx, etc. Pt agreeable with plan of care and has no further questions.

## 2020-04-12 ENCOUNTER — Encounter: Payer: Self-pay | Admitting: *Deleted

## 2020-04-12 NOTE — Telephone Encounter (Signed)
Noted agree with plan of care patient to follow up with [facility] for chronic intermittent symptoms.

## 2020-04-16 ENCOUNTER — Ambulatory Visit: Payer: Self-pay | Admitting: *Deleted

## 2020-04-16 ENCOUNTER — Other Ambulatory Visit: Payer: Self-pay

## 2020-04-16 VITALS — BP 138/95 | HR 71 | Wt 237.0 lb

## 2020-04-16 DIAGNOSIS — R030 Elevated blood-pressure reading, without diagnosis of hypertension: Secondary | ICD-10-CM

## 2020-04-16 NOTE — Progress Notes (Signed)
Pt in to clinic for BP and Wt check today. Tcon with NP Inetta Fermo last night.  Intake has only been an apple and bag of chips today. Only 14-16oz of water today. Reports she did not sleep well last night. Having lower back pain that she reports is from sleeping in a weird position. Thermacare heating patch given to pt.  New superficial forearm lacerations noted to L forearm. She has sleeve in place to cover and is hesitant to allow RN to evaluate. No s/sx infection noted. At least 3 new lacs since 12/2. Reports she was about to cut last night but NP called and she decided not to.  Denies SI/HI. ER precautions reviewed. Encouraged pt to consider increasing frequency of counseling sessions as she is only every 2-3 weeks right now due to finances. Advised of [employer] restarting SH services after 05/11/20 with pts seeing same counselor instead of whomever was on call, and that pt could consider using this service again. She is agreeable to this. Denies any additional questions/concerns right now.

## 2020-04-16 NOTE — Telephone Encounter (Signed)
Noted reviewed RN Rolly Salter note and RN Rolly Salter notified me today of patient cutting behavior verbally in clinic today as patient had arm bandaged again today and HR concerned asked me to speak with patient today as arms were wrapped yesterday at work and earlier in the week patient had injury from razor blade in her pocket.  Patient feeling a little better today, bandaid fingertip clean dry and intact.  Refused UD neosporin stated I have plenty at home but did need some more fingertip bandaids.  Given 5 from clinic stock.  A&Ox3 respirations even and unlabored, ace bandace wrapped around one arm, RN Rolly Salter performed wound check stated healing well noted a few new abrasions and discussed with patient.  Encouraged to keep her therapist appt this afternoon 12/7 and PCM appt 12/13 and labs 12/12.  Discussed covid vaccine #2 due this week if she wants to have insurance premium decrease and if extension required she would need to speak with HR.  Discussed RN Rolly Salter or I could help with rescheduling covid vaccination #2, recommended to get vaccine as infection rate increasing again and new variant omnicron spreading across [place] now.  Patient notified I spoke with HR about food quality concerns in vending machines and decreased vegetarian options.  Patient verbalized understanding information/instructions, agreed with plan of care and had no further questions at this time.  Internet outtage 04/15/2020 unable to document at time of patient telephone call 2048 duration 29 minutes.  Notified by patient "code red" called in patient workcenter yesterday due to patient seemed confused and not feeling well.  Patient contacted via telephone after HR notification patient sent home from work earlier in the day and told to follow up with [facility]/PCM.  Patient reported she woke up yesterday 12/06 with weird tingling in hands any time she moved  It took her two minutes to pick up towel in the morning to get ready for work.  It stressed her  out and she felt overwhelmed by these sensations.  She reported she was not okay at work felt nauseous and overwhelmed.  Hasn't been taking her omeprazole every day helps sometimes when she takes it.  Noted some light sensitivity and people talking bothering her.  Supervisor called HR  Most of yesterday morning a haze, things became overwhelming at work and then dizziness started.  She reported she has been twice to get her second covid vaccine but worries the needle will injure her body and leaves without getting vaccine.  Last [facility] appt Oct 2021.  Has appt with her therapist 12/07 and PCM 13 Dec, labs required 12 Dec.  [facility] hung up on her today after she fell asleep during the call.  She reported she slept 5-6 hours afternoon/evening.  Typically going to bed around 2000 but lies awake 4 hours before falling asleep alarm goes off 0400 and has to be ready to leave house 0500 as coworker picks her up to start shift at 0600.  Depends on friends/uber or walking as no longer has a car.  This has been stressful.  Medical expenses using up her paycheck.  Asked patient what she has been eating this week and reported in the past week had bagel and chips, Friday nachos real cheese, tomatoes, beans, jalapenos, bell peppers.  Fish and pasta today.  She has been cutting out chips and trying to eat apple a day and sometimes a banana.  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  She tends to eat pescatorian/vegetarian.  Prefers raw fish tilapia, shrimp, sushi.  Not getting to grocery store as often as she would hope due to walking 4 miles each way or [place] 2[place]5 [place] no longer has a bike either.  Benedetto Goad getting expensive.  Sometimes eats tofu as long as not [place] imitation tofu.  Noted she ate ice cream from work last week and it was soft no longer frozen with crystals and later that day stomach upset.   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  Still having muscle twitches present since he was child not worse not better.  Denied HI/SI or plan tonight. But did report this weekend she felt very depressed and a bad weekend didn't want to get out of bed or do much on her days off.   Did not disclose cutting to me on telephone last night.  Discussed I would talk with HR regarding work Biochemist, clinical and vegetarian options/concern about food quality/freshness on 12/7.  Discussed that with 4 hours of sleep per night (chronic sleep deprivation) can lead to hallucinations, poor mental/physical health/weight gain and 7-8 hours of sleep recommended per night.  Discussed 4-5 servings fruits/vegetables and lean proteins (fish, nuts, beans) recommended.  Discussed difficult to lose weight when stressed, not eating consistently.  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  Discussed covid vaccine still recommended and further assistance scheduling to please see RN Rolly Salter or I.  Discussed if patient having symptoms at work tomorrow to come to clinic for evaluation.  I would like to check her blood pressure/HR.  Will hold on labs since already scheduled with PCM.  Injury at work cut fingertip with razorblade checked tetanus history epic last booster 01/2019.  Patient denied discharge but some tenderness has been keeping covered with bandaid and applying generic neosporin.  Discussed [facility] new benefits starting with work insurance and to double check with HR if those visits would be available now or starting in near future.  Reminded patient EAP counselor available remotely also as work benefit.  Patient agreed to come to clinic tomorrow.  To work on her sleep/healthy food choices.  Keep her scheduled PCM and therapist appts.  Patient had no further questions or concerns at this time, verbalized understanding of information/instructions, agreed with plan of care and  had no further questions at this time.
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
## 2020-04-18 NOTE — Telephone Encounter (Signed)
Patient at work today, A&Ox3 NAD normal affect/behavior gait sure and steady in warehouse respiration even and unlabored RA skin warm dry and pink PEARRL

## 2020-04-23 NOTE — Telephone Encounter (Signed)
Reviewed RN Rolly Salter note and patient pending follow up with PCM.  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  Stated she found she had omeprazole left at home and no refill required also.  Patient reported at [facility] office 2 hours yesterday and visit did help to address her health concerns/needs.
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
## 2020-04-25 ENCOUNTER — Ambulatory Visit: Payer: Self-pay | Admitting: Registered Nurse

## 2020-04-25 ENCOUNTER — Encounter: Payer: Self-pay | Admitting: Registered Nurse

## 2020-04-25 ENCOUNTER — Other Ambulatory Visit: Payer: Self-pay

## 2020-04-25 VITALS — BP 147/99 | HR 109 | Temp 98.8°F | Resp 18

## 2020-04-25 DIAGNOSIS — Z566 Other physical and mental strain related to work: Secondary | ICD-10-CM

## 2020-04-25 DIAGNOSIS — R030 Elevated blood-pressure reading, without diagnosis of hypertension: Secondary | ICD-10-CM

## 2020-04-25 NOTE — Patient Instructions (Addendum)
Insomnia Insomnia is a sleep disorder that makes it difficult to fall asleep or stay asleep. Insomnia can cause fatigue, low energy, difficulty concentrating, mood swings, and poor performance at work or school. There are three different ways to classify insomnia:  Difficulty falling asleep.  Difficulty staying asleep.  Waking up too early in the morning. Any type of insomnia can be long-term (chronic) or short-term (acute). Both are common. Short-term insomnia usually lasts for three months or less. Chronic insomnia occurs at least three times a week for longer than three months. What are the causes? Insomnia may be caused by another condition, situation, or substance, such as:  Anxiety.  Certain medicines.  Gastroesophageal reflux disease (GERD) or other gastrointestinal conditions.  Asthma or other breathing conditions.  Restless legs syndrome, sleep apnea, or other sleep disorders.  Chronic pain.  Menopause.  Stroke.  Abuse of alcohol, tobacco, or illegal drugs.  Mental health conditions, such as depression.  Caffeine.  Neurological disorders, such as Alzheimer's disease.  An overactive thyroid (hyperthyroidism). Sometimes, the cause of insomnia may not be known. What increases the risk? Risk factors for insomnia include:  Gender. Women are affected more often than men.  Age. Insomnia is more common as you get older.  Stress.  Lack of exercise.  Irregular work schedule or working night shifts.  Traveling between different time zones.  Certain medical and mental health conditions. What are the signs or symptoms? If you have insomnia, the main symptom is having trouble falling asleep or having trouble staying asleep. This may lead to other symptoms, such as:  Feeling fatigued or having low energy.  Feeling nervous about going to sleep.  Not feeling rested in the morning.  Having trouble concentrating.  Feeling irritable, anxious, or depressed. How  is this diagnosed? This condition may be diagnosed based on:  Your symptoms and medical history. Your health care provider may ask about: ? Your sleep habits. ? Any medical conditions you have. ? Your mental health.  A physical exam. How is this treated? Treatment for insomnia depends on the cause. Treatment may focus on treating an underlying condition that is causing insomnia. Treatment may also include:  Medicines to help you sleep.  Counseling or therapy.  Lifestyle adjustments to help you sleep better. Follow these instructions at home: Eating and drinking   Limit or avoid alcohol, caffeinated beverages, and cigarettes, especially close to bedtime. These can disrupt your sleep.  Do not eat a large meal or eat spicy foods right before bedtime. This can lead to digestive discomfort that can make it hard for you to sleep. Sleep habits   Keep a sleep diary to help you and your health care provider figure out what could be causing your insomnia. Write down: ? When you sleep. ? When you wake up during the night. ? How well you sleep. ? How rested you feel the next day. ? Any side effects of medicines you are taking. ? What you eat and drink.  Make your bedroom a dark, comfortable place where it is easy to fall asleep. ? Put up shades or blackout curtains to block light from outside. ? Use a white noise machine to block noise. ? Keep the temperature cool.  Limit screen use before bedtime. This includes: ? Watching TV. ? Using your smartphone, tablet, or computer.  Stick to a routine that includes going to bed and waking up at the same times every day and night. This can help you fall asleep faster. Consider   making a quiet activity, such as reading, part of your nighttime routine.  Try to avoid taking naps during the day so that you sleep better at night.  Get out of bed if you are still awake after 15 minutes of trying to sleep. Keep the lights down, but try reading or  doing a quiet activity. When you feel sleepy, go back to bed. General instructions  Take over-the-counter and prescription medicines only as told by your health care provider.  Exercise regularly, as told by your health care provider. Avoid exercise starting several hours before bedtime.  Use relaxation techniques to manage stress. Ask your health care provider to suggest some techniques that may work well for you. These may include: ? Breathing exercises. ? Routines to release muscle tension. ? Visualizing peaceful scenes.  Make sure that you drive carefully. Avoid driving if you feel very sleepy.  Keep all follow-up visits as told by your health care provider. This is important. Contact a health care provider if:  You are tired throughout the day.  You have trouble in your daily routine due to sleepiness.  You continue to have sleep problems, or your sleep problems get worse. Get help right away if:  You have serious thoughts about hurting yourself or someone else. If you ever feel like you may hurt yourself or others, or have thoughts about taking your own life, get help right away. You can go to your nearest emergency department or call:  Your local emergency services (911 in the [place]  A suicide crisis helpline, such as the [place] at 651-165-4276. This is open 24 hours a day. Summary  Insomnia is a sleep disorder that makes it difficult to fall asleep or stay asleep.  Insomnia can be long-term (chronic) or short-term (acute).  Treatment for insomnia depends on the cause. Treatment may focus on treating an underlying condition that is causing insomnia.  Keep a sleep diary to help you and your health care provider figure out what could be causing your insomnia. This information is not intended to replace advice given to you by your health care provider. Make sure you discuss any questions you have with your health care provider. Document  Revised: 04/09/2017 Document Reviewed: 02/04/2017 Elsevier Patient Education  [place], Adult After being diagnosed with an anxiety disorder, you may be relieved to know why you have felt or behaved a certain way. You may also feel overwhelmed about the treatment ahead and what it will mean for your life. With care and support, you can manage this condition and recover from it. How to manage lifestyle changes Managing stress and anxiety  Stress is your body's reaction to life changes and events, both good and bad. Most stress will last just a few hours, but stress can be ongoing and can lead to more than just stress. Although stress can play a major role in anxiety, it is not the same as anxiety. Stress is usually caused by something external, such as a deadline, test, or competition. Stress normally passes after the triggering event has ended.  Anxiety is caused by something internal, such as imagining a terrible outcome or worrying that something will go wrong that will devastate you. Anxiety often does not go away even after the triggering event is over, and it can become long-term (chronic) worry. It is important to understand the differences between stress and anxiety and to manage your stress effectively so that it does not lead to an  anxious response. Talk with your health care provider or a counselor to learn more about reducing anxiety and stress. He or she may suggest tension reduction techniques, such as:  Music therapy. This can include creating or listening to music that you enjoy and that inspires you.  Mindfulness-based meditation. This involves being aware of your normal breaths while not trying to control your breathing. It can be done while sitting or walking.  Centering prayer. This involves focusing on a word, phrase, or sacred image that means something to you and brings you peace.  Deep breathing. To do this, expand your stomach and inhale slowly  through your nose. Hold your breath for 3-5 seconds. Then exhale slowly, letting your stomach muscles relax.  Self-talk. This involves identifying thought patterns that lead to anxiety reactions and changing those patterns.  Muscle relaxation. This involves tensing muscles and then relaxing them. Choose a tension reduction technique that suits your lifestyle and personality. These techniques take time and practice. Set aside 5-15 minutes a day to do them. Therapists can offer counseling and training in these techniques. The training to help with anxiety may be covered by some insurance plans. Other things you can do to manage stress and anxiety include:  Keeping a stress/anxiety diary. This can help you learn what triggers your reaction and then learn ways to manage your response.  Thinking about how you react to certain situations. You may not be able to control everything, but you can control your response.  Making time for activities that help you relax and not feeling guilty about spending your time in this way.  Visual imagery and yoga can help you stay calm and relax.  Medicines Medicines can help ease symptoms. Medicines for anxiety include:  Anti-anxiety drugs.  Antidepressants. Medicines are often used as a primary treatment for anxiety disorder. Medicines will be prescribed by a health care provider. When used together, medicines, psychotherapy, and tension reduction techniques may be the most effective treatment. Relationships Relationships can play a big part in helping you recover. Try to spend more time connecting with trusted friends and family members. Consider going to couples counseling, taking family education classes, or going to family therapy. Therapy can help you and others better understand your condition. How to recognize changes in your anxiety Everyone responds differently to treatment for anxiety. Recovery from anxiety happens when symptoms decrease and stop  interfering with your daily activities at home or work. This may mean that you will start to:  Have better concentration and focus. Worry will interfere less in your daily thinking.  Sleep better.  Be less irritable.  Have more energy.  Have improved memory. It is important to recognize when your condition is getting worse. Contact your health care provider if your symptoms interfere with home or work and you feel like your condition is not improving. Follow these instructions at home: Activity  Exercise. Most adults should do the following: ? Exercise for at least 150 minutes each week. The exercise should increase your heart rate and make you sweat (moderate-intensity exercise). ? Strengthening exercises at least twice a week.  Get the right amount and quality of sleep. Most adults need 7-9 hours of sleep each night. Lifestyle   Eat a healthy diet that includes plenty of vegetables, fruits, whole grains, low-fat dairy products, and lean protein. Do not eat a lot of foods that are high in solid fats, added sugars, or salt.  Make choices that simplify your life.  Do not use  any products that contain nicotine or tobacco, such as cigarettes, e-cigarettes, and chewing tobacco. If you need help quitting, ask your health care provider.  Avoid caffeine, alcohol, and certain over-the-counter cold medicines. These may make you feel worse. Ask your pharmacist which medicines to avoid. General instructions  Take over-the-counter and prescription medicines only as told by your health care provider.  Keep all follow-up visits as told by your health care provider. This is important. Where to find support You can get help and support from these sources:  Self-help groups.  Online and [employer].  A trusted spiritual leader.  Couples counseling.  Family education classes.  Family therapy. Where to find more information You may find that joining a support group helps  you deal with your anxiety. The following sources can help you locate counselors or support groups near you:  Mental Health America: www.mentalhealthamerica.net  Anxiety and Depression Association of [place] (ADAA): ProgramCam.de  [employer] Mental Illness (NAMI): www.nami.org Contact a health care provider if you:  Have a hard time staying focused or finishing daily tasks.  Spend many hours a day feeling worried about everyday life.  Become exhausted by worry.  Start to have headaches, feel tense, or have nausea.  Urinate more than normal.  Have diarrhea. Get help right away if you have:  A racing heart and shortness of breath.  Thoughts of hurting yourself or others. If you ever feel like you may hurt yourself or others, or have thoughts about taking your own life, get help right away. You can go to your nearest emergency department or call:  Your local emergency services (911 in the [place]  A suicide crisis helpline, such as the National Suicide Prevention Lifeline at 919-392-8096. This is open 24 hours a day. Summary  Taking steps to learn and use tension reduction techniques can help calm you and help prevent triggering an anxiety reaction.  When used together, medicines, psychotherapy, and tension reduction techniques may be the most effective treatment.  Family, friends, and partners can play a big part in helping you recover from an anxiety disorder. This information is not intended to replace advice given to you by your health care provider. Make sure you discuss any questions you have with your health care provider. Document Revised: 09/27/2018 Document Reviewed: 09/27/2018 Elsevier Patient Education  2020 [employer].  Managing Your Hypertension Hypertension is commonly called high blood pressure. This is when the force of your blood pressing against the walls of your arteries is too strong. Arteries are blood vessels that carry blood from your heart  throughout your body. Hypertension forces the heart to work harder to pump blood, and may cause the arteries to become narrow or stiff. Having untreated or uncontrolled hypertension can cause heart attack, stroke, kidney disease, and other problems. What are blood pressure readings? A blood pressure reading consists of a higher number over a lower number. Ideally, your blood pressure should be below 120/80. The first ("top") number is called the systolic pressure. It is a measure of the pressure in your arteries as your heart beats. The second ("bottom") number is called the diastolic pressure. It is a measure of the pressure in your arteries as the heart relaxes. What does my blood pressure reading mean? Blood pressure is classified into four stages. Based on your blood pressure reading, your health care provider may use the following stages to determine what type of treatment you need, if any. Systolic pressure and diastolic pressure are measured in  a unit called mm Hg. Normal  Systolic pressure: below 120.  Diastolic pressure: below 80. Elevated  Systolic pressure: 120-129.  Diastolic pressure: below 80. Hypertension stage 1  Systolic pressure: 130-139.  Diastolic pressure: 80-89. Hypertension stage 2  Systolic pressure: 140 or above.  Diastolic pressure: 90 or above. What health risks are associated with hypertension? Managing your hypertension is an important responsibility. Uncontrolled hypertension can lead to:  A heart attack.  A stroke.  A weakened blood vessel (aneurysm).  Heart failure.  Kidney damage.  Eye damage.  Metabolic syndrome.  Memory and concentration problems. What changes can I make to manage my hypertension? Hypertension can be managed by making lifestyle changes and possibly by taking medicines. Your health care provider will help you make a plan to bring your blood pressure within a normal range. Eating and drinking   Eat a diet that is high  in fiber and potassium, and low in salt (sodium), added sugar, and fat. An example eating plan is called the DASH (Dietary Approaches to Stop Hypertension) diet. To eat this way: ? Eat plenty of fresh fruits and vegetables. Try to fill half of your plate at each meal with fruits and vegetables. ? Eat whole grains, such as whole wheat pasta, brown rice, or whole grain bread. Fill about one quarter of your plate with whole grains. ? Eat low-fat diary products. ? Avoid fatty cuts of meat, processed or cured meats, and poultry with skin. Fill about one quarter of your plate with lean proteins such as fish, chicken without skin, beans, eggs, and tofu. ? Avoid premade and processed foods. These tend to be higher in sodium, added sugar, and fat.  Reduce your daily sodium intake. Most people with hypertension should eat less than 1,500 mg of sodium a day.  Limit alcohol intake to no more than 1 drink a day for nonpregnant women and 2 drinks a day for men. One drink equals 12 oz of beer, 5 oz of wine, or 1 oz of hard liquor. Lifestyle  Work with your health care provider to maintain a healthy body weight, or to lose weight. Ask what an ideal weight is for you.  Get at least 30 minutes of exercise that causes your heart to beat faster (aerobic exercise) most days of the week. Activities may include walking, swimming, or biking.  Include exercise to strengthen your muscles (resistance exercise), such as weight lifting, as part of your weekly exercise routine. Try to do these types of exercises for 30 minutes at least 3 days a week.  Do not use any products that contain nicotine or tobacco, such as cigarettes and e-cigarettes. If you need help quitting, ask your health care provider.  Control any long-term (chronic) conditions you have, such as high cholesterol or diabetes. Monitoring  Monitor your blood pressure at home as told by your health care provider. Your personal target blood pressure may vary  depending on your medical conditions, your age, and other factors.  Have your blood pressure checked regularly, as often as told by your health care provider. Working with your health care provider  Review all the medicines you take with your health care provider because there may be side effects or interactions.  Talk with your health care provider about your diet, exercise habits, and other lifestyle factors that may be contributing to hypertension.  Visit your health care provider regularly. Your health care provider can help you create and adjust your plan for managing hypertension. Will I need  medicine to control my blood pressure? Your health care provider may prescribe medicine if lifestyle changes are not enough to get your blood pressure under control, and if:  Your systolic blood pressure is 130 or higher.  Your diastolic blood pressure is 80 or higher. Take medicines only as told by your health care provider. Follow the directions carefully. Blood pressure medicines must be taken as prescribed. The medicine does not work as well when you skip doses. Skipping doses also puts you at risk for problems. Contact a health care provider if:  You think you are having a reaction to medicines you have taken.  You have repeated (recurrent) headaches.  You feel dizzy.  You have swelling in your ankles.  You have trouble with your vision. Get help right away if:  You develop a severe headache or confusion.  You have unusual weakness or numbness, or you feel faint.  You have severe pain in your chest or abdomen.  You vomit repeatedly.  You have trouble breathing. Summary  Hypertension is when the force of blood pumping through your arteries is too strong. If this condition is not controlled, it may put you at risk for serious complications.  Your personal target blood pressure may vary depending on your medical conditions, your age, and other factors. For most people, a normal  blood pressure is less than 120/80.  Hypertension is managed by lifestyle changes, medicines, or both. Lifestyle changes include weight loss, eating a healthy, low-sodium diet, exercising more, and limiting alcohol. This information is not intended to replace advice given to you by your health care provider. Make sure you discuss any questions you have with your health care provider. Document Revised: 08/19/2018 Document Reviewed: 03/25/2016 Elsevier Patient Education  2020 [employer].

## 2020-04-25 NOTE — Progress Notes (Signed)
Subjective:    Patient ID: Eric Herman, adult    DOB: [birth year], 24 y.o.   MRN: [id number]  24y/o caucasian single male established patient here for evaluation of blood pressure.  Thinks it may be elevated.  Reported spaced out at work table for a few minutes.  Stressors at work last week was suspended for a day.  Taking amitriptyline 12.5mg  at bedtime and supposed to increase to 25mg  tomorrow night.  Didn't sleep well last night.  Thinks the two things are contributing to her symptoms.  Has follow up scheduled with her mental health provider.  No arm wrappings today but did note puncture marks and bruise right forearm reported cat scratched her and she did wash wounds thoroughly when injury occurred earlier in the week.  Denied fever/chills/discharge from wounds.  She took her spironolactone.  She reported that at home blood pressure lower than at work readings.  Denied chest pain, visual changes, dyspnea/shortness of breath.     Review of Systems  Constitutional: Negative for activity change, appetite change, chills, diaphoresis and fever.  HENT: Negative for congestion, trouble swallowing and voice change.   Eyes: Negative for photophobia and visual disturbance.  Respiratory: Negative for cough, shortness of breath, wheezing and stridor.   Cardiovascular: Negative for chest pain and palpitations.  Gastrointestinal: Negative for diarrhea, nausea and vomiting.  Endocrine: Negative for cold intolerance and heat intolerance.  Genitourinary: Negative for difficulty urinating.  Musculoskeletal: Negative for gait problem, neck pain and neck stiffness.  Allergic/Immunologic: Negative for food allergies.  Neurological: Negative for dizziness, tremors, syncope, speech difficulty, weakness, light-headedness, numbness and headaches.  Hematological: Negative for adenopathy. Does not bruise/bleed easily.  Psychiatric/Behavioral: Positive for sleep disturbance. Negative for agitation and self-injury.  The patient is nervous/anxious.        Objective:   Physical Exam Vitals reviewed.  Constitutional:      General: She is awake. She is not in acute distress.    Appearance: Normal appearance. She is well-developed, well-groomed and overweight. She is not ill-appearing, toxic-appearing or diaphoretic.  HENT:     Head: Normocephalic and atraumatic.     Right Ear: External ear normal.     Left Ear: External ear normal.     Nose: Nose normal. No congestion or rhinorrhea.     Mouth/Throat:     Pharynx: Oropharynx is clear.  Eyes:     General: Lids are normal. Vision grossly intact. Gaze aligned appropriately. Allergic shiner present. No visual field deficit or scleral icterus.       Right eye: No discharge.        Left eye: No discharge.     Extraocular Movements: Extraocular movements intact.     Conjunctiva/sclera: Conjunctivae normal.     Pupils: Pupils are equal, round, and reactive to light.  Neck:     Trachea: Trachea and phonation normal. No abnormal tracheal secretions or tracheal deviation.  Cardiovascular:     Rate and Rhythm: Regular rhythm. Tachycardia present.     Pulses: Normal pulses.          Radial pulses are 2+ on the right side and 2+ on the left side.  Pulmonary:     Effort: Pulmonary effort is normal. No respiratory distress.     Breath sounds: Normal breath sounds and air entry. No stridor or transmitted upper airway sounds. No wheezing.     Comments: Spoke full sentences without difficulty; wearing mask due to covid 19 pandemic; no cough observed in clinic Abdominal:  General: Abdomen is flat.  Musculoskeletal:        General: Signs of injury present. No swelling, tenderness or deformity. Normal range of motion.     Cervical back: Normal range of motion and neck supple. No edema, erythema, signs of trauma, rigidity or crepitus. No pain with movement. Normal range of motion.     Right lower leg: No edema.     Left lower leg: No edema.  Lymphadenopathy:      Head:     Right side of head: No submental or preauricular adenopathy.     Left side of head: No submental or preauricular adenopathy.  Skin:    General: Skin is warm and dry.     Capillary Refill: Capillary refill takes less than 2 seconds.     Coloration: Skin is not ashen, cyanotic, jaundiced, mottled, pale or sallow.     Findings: Abrasion, bruising, ecchymosis, erythema, signs of injury, rash and wound present. No abscess, acne, burn or petechiae. Rash is macular and papular. Rash is not crusting, nodular, purpuric, pustular, scaling, urticarial or vesicular.     Nails: There is no clubbing.       Neurological:     General: No focal deficit present.     Mental Status: She is alert and oriented to person, place, and time. Mental status is at baseline.     GCS: GCS eye subscore is 4. GCS verbal subscore is 5. GCS motor subscore is 6.     Cranial Nerves: Cranial nerves are intact. No cranial nerve deficit, dysarthria or facial asymmetry.     Sensory: Sensation is intact. No sensory deficit.     Motor: Motor function is intact. No weakness, tremor, atrophy, abnormal muscle tone or seizure activity.     Coordination: Coordination is intact. Coordination normal.     Gait: Gait is intact. Gait normal.     Comments: Gait sure and steady in clinic; in/out of chair without difficulty; bilateral hand grasp equal 5/5  Psychiatric:        Attention and Perception: Attention and perception normal.        Mood and Affect: Affect normal. Mood is anxious.        Speech: Speech normal.        Behavior: Behavior normal. Behavior is cooperative.        Thought Content: Thought content normal.        Cognition and Memory: Cognition and memory normal.        Judgment: Judgment normal.     Initial BP with talking and legs crossed 147/120 sitting 3 minutes      Assessment & Plan:  A-elevated blood pressure, work stressors  P-Discussed avoid further caffeine intake today.  Denied any increased  caffeine intake today. Go to sit in relaxation room for 5-10 minutes before returning to workcenter.  Discussed ER if chest pain, worst headache of life, dyspnea or visual changes for re-evaluation.  Will see RN Rolly Salter next week for repeat BP check.  Exitcare handouts insomnia, managing anxiety and hypertension.  Patient verbalized understanding information/instructions, agreed with plan of care and had no further questions at this time.  Discussed sleep hygiene re: regular schedule, avoid screen time 2 hours prior to bed time or wear blue light blocking glasses/consider changing screen brightness/settings to dark, dark/quiet/cool bedroom, avoid large meals within 1 hour of sleeping; avoid strenuous exercise within one hour of bedtime; avoid caffeine use after lunch. Try shower/bath; warm non-alcoholic/caffeinated drink prior to bed. If  unable to sleep move to another room with lights out until sleepy/write concerns on notepad or read boring material until sleepy then return to bedroom avoid screen time in middle of night. Exitcare handout on insomnia.  Follow up with PCM/therapist if no improvement in symptoms with above strategies. Patient verbalized understanding of instructions, agreed with plan of care and had no further questions at this time. P2: stress reduction, exercise

## 2020-05-13 ENCOUNTER — Telehealth: Payer: Self-pay | Admitting: *Deleted

## 2020-05-13 NOTE — Telephone Encounter (Signed)
RN notified by HR that pt called out of work this morning reporting fever.   Spoke with pt by phone this evening and she reports subjective fever upon waking this morning. Around 1400 had one episode of emesis though has been having nause and dry heaving most of the day. Feels okay currently. Has been able to tolerate small sips of fluid. Has not tried eating. Discussed small sips every 15 min and BRAT diet for today/tomorrow then advance as tolerated.  Only drive thru covid testing available with long waits expected. [facility] booking a week out. Pt reliant on roommate for transport and doubts they will be able to take pt for testing. Will discuss testing requirement and quarantine timeline with NP Inetta Fermo tomorrow for further determinations.

## 2020-05-14 ENCOUNTER — Encounter: Payer: Self-pay | Admitting: *Deleted

## 2020-05-14 NOTE — Telephone Encounter (Signed)
Reviewed RN Rolly Salter note will attempt to find testing site that will work for patient and her transportation

## 2020-05-14 NOTE — Telephone Encounter (Signed)
Spoke with pt by phone. Vomiting resolved. Still with dizziness and fatigue. Denies diarrhea. Denies fever/chills.   Day 1 05/13/20 Day 5 05/17/20 RTW 05/18/20 with strict mask use for additional 5 days.   Will not be able to obtain testing due to lack of transportation.

## 2020-05-17 NOTE — Telephone Encounter (Signed)
Reviewed RN Haley note agree with plan of care. 

## 2020-05-17 NOTE — Telephone Encounter (Signed)
Spoke with pt by phone. Fatigue improving but dizziness and all other sx now resolved. Completes quarantine today. RTW tomorrow 1/8, or next scheduled workday with strict mask use thru 05/22/20. Denies further questions or concerns.

## 2020-05-20 NOTE — Telephone Encounter (Signed)
Reviewed RN Rolly Salter note patient RTW 05/20/2020 and strict mask use through 05/22/2020

## 2020-05-20 NOTE — Telephone Encounter (Signed)
Supervisor confirmed RTW today 1/10. Strict mask use thru 1/12. Closing encounter.

## 2020-06-06 ENCOUNTER — Encounter: Payer: Self-pay | Admitting: Registered Nurse

## 2020-06-06 ENCOUNTER — Telehealth: Payer: Self-pay | Admitting: Registered Nurse

## 2020-06-06 DIAGNOSIS — M25532 Pain in left wrist: Secondary | ICD-10-CM

## 2020-06-06 DIAGNOSIS — R21 Rash and other nonspecific skin eruption: Secondary | ICD-10-CM

## 2020-06-06 DIAGNOSIS — M79631 Pain in right forearm: Secondary | ICD-10-CM

## 2020-06-06 MED ORDER — AQUAPHOR EX OINT: TOPICAL_OINTMENT | CUTANEOUS | 0 refills | Status: AC | PRN

## 2020-06-06 MED ORDER — ACETAMINOPHEN 500 MG PO TABS: 1000.0000 mg | ORAL_TABLET | Freq: Four times a day (QID) | ORAL | 0 refills | Status: AC | PRN

## 2020-06-06 NOTE — Telephone Encounter (Signed)
Patient presented to clinic asking for compression for right forearm and left wrist as having some musculoskeletal pain today with activity.  Patient reported hasn't slept well for a couple of days and not eating well.  Promises to eat a good meal for dinner tonight as not many options vegetarian for lunch at work dining room.  Skin warm dry and pink respirations even and unlabored Good eye contact and hygiene.  Has flaky dry skin left upper eyelid discussed avoid rubbing and place aquaphor over affected area and shower after work once she is home as dust from [employer] at work could worsen rash.  May use cool compress if discomfort.  No erythema/discharge or edema noted.  Patient vision normal.  Patient reported keeping hydrated.  Patient to follow up if new or worsening pain or signs of skin infection e.g. redness/discharge/worsening pain/new swelling.  Given 2 inch ace bandage to wrap right forearm and velcro wrist support dispensed from clinic stock to patient along with 4 UD Aquaphor ointment to patient from clinic stock. May use tylenol 1000mg  po q6h prn pain OTC.  Discussed cryotherapy 15 minutes QID prn pain also.  Gentle AROM/stretching QID. Patient verbalized understanding information/instructions, agreed with plan of care and had no further questions at this time.

## 2020-06-20 ENCOUNTER — Ambulatory Visit: Payer: Self-pay | Admitting: Registered Nurse

## 2020-06-20 ENCOUNTER — Other Ambulatory Visit: Payer: Self-pay

## 2020-06-20 ENCOUNTER — Encounter: Payer: Self-pay | Admitting: Registered Nurse

## 2020-06-20 VITALS — Resp 16

## 2020-06-20 DIAGNOSIS — S61210A Laceration without foreign body of right index finger without damage to nail, initial encounter: Secondary | ICD-10-CM

## 2020-06-20 MED ORDER — BACITRACIN-NEOMYCIN-POLYMYXIN 400-5-5000 EX OINT: 1.0000 "application " | TOPICAL_OINTMENT | Freq: Two times a day (BID) | CUTANEOUS | 0 refills | Status: AC

## 2020-06-20 NOTE — Patient Instructions (Signed)
Laceration Care, Adult A laceration is a cut that may go through all layers of the skin and into the tissue that is right under the skin. Some lacerations heal on their own. Others need to be closed with stitches (sutures), staples, skin adhesive strips, or skin glue. Proper care of a laceration reduces the risk for infection, helps the laceration heal better, and may prevent scarring. How to care for your laceration Wash your hands with soap and water before touching your wound or changing your bandage (dressing). If soap and water are not available, use hand sanitizer. Keep the wound clean and dry. If you were given a dressing, you should change it at least once a day, or as told by your health care provider. You should also change it if it becomes wet or dirty. If sutures or staples were used:  Keep the wound completely dry for the first 24 hours, or as told by your health care provider. After that time, you may shower or bathe. However, make sure that the wound is not soaked in water until after the sutures or staples have been removed.  Clean the wound once each day, or as told by your health care provider: ? Wash the wound with soap and water. ? Rinse the wound with water to remove all soap. ? Pat the wound dry with a clean towel. Do not rub the wound.  After cleaning the wound, apply a thin layer of antibiotic ointment as told by your health care provider. This will help prevent infection and keep the dressing from sticking to the wound.  Have the sutures or staples removed as told by your health care provider. If skin adhesive strips were used:  Do not get the skin adhesive strips wet. You may shower or bathe, but be careful to keep the wound dry.  If the wound gets wet, pat it dry with a clean towel. Do not rub the wound.  Skin adhesive strips fall off on their own. You may trim the strips as the wound heals. Do not remove skin adhesive strips that are still stuck to the wound. They  will fall off in time. If skin glue was used:  Try to keep the wound dry, but you may briefly wet it in the shower or bath. Do not soak the wound in water, such as by swimming.  After you have showered or bathed, gently pat the wound dry with a clean towel. Do not rub the wound.  Do not do any activities that will make you sweat heavily until the skin glue has fallen off on its own.  Do not apply liquid, cream, or ointment medicine to the wound while the skin glue is in place. Using those may loosen the film before the wound has healed.  If a dressing is placed over the wound, be careful not to apply tape directly over the skin glue. Doing that may cause the glue to be pulled off before the wound has healed.  Do not pick at the glue. Skin glue usually remains in place for 5-10 days and then falls off the skin. General instructions  Take over-the-counter and prescription medicines only as told by your health care provider.  If you were prescribed an antibiotic medicine or ointment, take or apply it as told by your health care provider. Do not stop using it even if your condition improves.  Do not scratch or pick at the wound.  Check your wound every day for signs of infection.   Watch for: ? Redness, swelling, or pain. ? Fluid, blood, or pus.  Raise (elevate) the injured area above the level of your heart while you are sitting or lying down for the first 24-48 hours after the laceration is repaired.  If directed, put ice on the affected area: ? Put ice in a plastic bag. ? Place a towel between your skin and the bag. ? Leave the ice on for 20 minutes, 2-3 times a day.  Keep all follow-up visits as told by your health care provider. This is important.   Contact a health care provider if:  You received a tetanus shot and you have swelling, severe pain, redness, or bleeding at the injection site.  You have a fever.  A wound that was closed breaks open.  You notice a bad smell  coming from your wound or your dressing.  You notice something coming out of the wound, such as wood or glass.  Your pain is not controlled with medicine.  You have increased redness, swelling, or pain at the site of your wound.  You have fluid, blood, or pus coming from your wound.  You need to change the dressing often due to fluid, blood, or pus that is draining from the wound.  You develop a new rash.  You develop numbness around the wound. Get help right away if:  You develop severe swelling around the wound.  Your pain suddenly increases and is severe.  You develop painful lumps near the wound or on skin anywhere else on your body.  You have a red streak going away from your wound.  The wound is on your hand or foot and you cannot properly move a finger or toe.  The wound is on your hand or foot, and you notice that your fingers or toes look pale or bluish. Summary  A laceration is a cut that may go through all layers of the skin and into the tissue that is right under the skin.  Some lacerations heal on their own. Others need to be closed with stitches (sutures), staples, skin adhesive strips, or skin glue.  Proper care of a laceration reduces the risk of infection, helps the laceration heal better, and prevents scarring. This information is not intended to replace advice given to you by your health care provider. Make sure you discuss any questions you have with your health care provider. Document Revised: 06/25/2017 Document Reviewed: 05/17/2017 Elsevier Patient Education  2021 [employer]  

## 2020-06-20 NOTE — Progress Notes (Signed)
Subjective:    Patient ID: Eric Herman, adult    DOB: [birth year], 25 y.o.   MRN: [id number]  25 y/o caucasian male established patient reported was talking on telephone while cutting food/prep at home and caught tip of index finger with knife.  Has been applying neosporin generic and covering with bandaid when at work.  Denied fever/chills/purulent discharge/still bleeding.  Area tender.     Review of Systems  Constitutional: Negative for activity change, appetite change, chills, diaphoresis, fatigue and fever.  HENT: Negative for trouble swallowing and voice change.   Eyes: Negative for photophobia and visual disturbance.  Respiratory: Negative for cough, shortness of breath, wheezing and stridor.   Cardiovascular: Negative for chest pain.  Gastrointestinal: Negative for diarrhea and vomiting.  Endocrine: Negative for cold intolerance and heat intolerance.  Genitourinary: Negative for difficulty urinating.  Musculoskeletal: Positive for myalgias. Negative for arthralgias, back pain, gait problem, joint swelling, neck pain and neck stiffness.  Skin: Positive for wound. Negative for color change, pallor and rash.  Allergic/Immunologic: Negative for food allergies.  Neurological: Negative for tremors, syncope, speech difficulty and weakness.  Hematological: Negative for adenopathy. Does not bruise/bleed easily.  Psychiatric/Behavioral: Negative for agitation, confusion, sleep disturbance and suicidal ideas. The patient is not nervous/anxious and is not hyperactive.        Objective:   Physical Exam Constitutional:      General: She is awake. She is not in acute distress.    Appearance: Normal appearance. She is well-developed and well-groomed. She is not ill-appearing, toxic-appearing or diaphoretic.  HENT:     Head: Normocephalic and atraumatic.     Right Ear: External ear normal.     Left Ear: External ear normal.     Nose: Nose normal.     Mouth/Throat:     Pharynx:  Oropharynx is clear.  Eyes:     General: No visual field deficit or scleral icterus.       Right eye: No discharge.        Left eye: No discharge.     Extraocular Movements: Extraocular movements intact.     Conjunctiva/sclera: Conjunctivae normal.     Pupils: Pupils are equal, round, and reactive to light.  Cardiovascular:     Rate and Rhythm: Normal rate and regular rhythm.     Pulses: Normal pulses.          Radial pulses are 2+ on the right side and 2+ on the left side.  Pulmonary:     Effort: Pulmonary effort is normal. No respiratory distress.     Breath sounds: Normal breath sounds and air entry. No stridor or transmitted upper airway sounds. No wheezing.     Comments: Spoke full sentences without difficulty; no cough noted in exam room; wearing mask due to covid 19 pandemic Abdominal:     General: Abdomen is flat.  Musculoskeletal:        General: Tenderness and signs of injury present. No swelling. Normal range of motion.     Right elbow: Normal.     Left elbow: Normal.     Right forearm: Normal.     Left forearm: Normal.     Right wrist: Normal.     Right hand: Laceration and tenderness present. Normal range of motion. Normal strength. Normal capillary refill. Normal pulse.     Left hand: Normal.       Arms:     Cervical back: Normal range of motion and neck supple.  Comments: 1cm laceration palmar right distal phalanx no nail damage; bilateral hands/fingers equal AROM/strength 5/5  Skin warm and dry no discharge; removed bandaids for exam and patient washed hands with soap and water as soiled with dust  Lymphadenopathy:     Cervical: No cervical adenopathy.     Right cervical: No superficial cervical adenopathy.    Left cervical: No superficial cervical adenopathy.  Skin:    General: Skin is warm.     Capillary Refill: Capillary refill takes less than 2 seconds.     Coloration: Skin is not ashen, cyanotic, jaundiced or sallow.     Findings: Signs of injury and  laceration present. No abscess, acne, bruising, burn, ecchymosis, erythema, lesion, petechiae or rash.     Nails: There is no clubbing.     Comments: Skin moist slight maceration following outline of removed bandaid; finger callouses and laceration right 2nd digit distal to DIP joint no nail injury  Neurological:     General: No focal deficit present.     Mental Status: She is alert and oriented to person, place, and time. Mental status is at baseline.     GCS: GCS eye subscore is 4. GCS verbal subscore is 5. GCS motor subscore is 6.     Cranial Nerves: Cranial nerves are intact. No cranial nerve deficit, dysarthria or facial asymmetry.     Sensory: Sensation is intact. No sensory deficit.     Motor: Motor function is intact. No weakness, tremor, atrophy, abnormal muscle tone or seizure activity.     Coordination: Coordination is intact. Coordination normal.     Gait: Gait is intact. Gait normal.     Comments: Gait sure and steady in clinic; bilateral hand grasp equal 5/5  Psychiatric:        Attention and Perception: Attention and perception normal.        Mood and Affect: Mood and affect normal.        Speech: Speech normal.        Behavior: Behavior normal. Behavior is cooperative.        Thought Content: Thought content normal.        Cognition and Memory: Cognition and memory normal.        Judgment: Judgment normal.           Assessment & Plan:  A-right index finger laceration without nail damage initial visit  P-Current on Tdap.  Due to slight maceration surrounding area unable to apply steristrips at this time.  Will have RN Rolly Salter re-evaluate 2/11 or 2/14 to see if skin better tomorrow for application.  Patient was instructed to avoid repetitive impact to laceration site. Do not soak finger in dirty water until laceration healed avoid pool, lake, hot tub, dirty sink water. May shower apply neosporin BID keep wounds covered they will heal faster and prevent contamination rubbing  from clothing tearing off scabs.  Given triple antibiotic and bandaids from clinic stop change at least daily after washing with soap and water.  Change prn soiling.  Remove bandaids when resting at home to allow skin time to airdry.   Exitcare handout on laceration.  Patient refused surginet/finger splint. Medications as directed. Call or return to clinic as needed if symptoms of infection e.g. fever/chills/purulent discharge/worsening swelling/redness.  May use tylenol 1000mg  po q6h prn pain/swelling.  Elevated if swollen may apply ice 15 minutes QID prn pain/swelling. worsen or fail to improve as anticipated. Patient verbalized agreement and understanding of treatment plan and had no  further questions at this time. P2: ROM, injury prevention

## 2020-06-21 ENCOUNTER — Ambulatory Visit (HOSPITAL_COMMUNITY)
Admission: RE | Admit: 2020-06-21 | Discharge: 2020-06-21 | Disposition: A | Discharge status: Home / Self Care | Payer: Self-pay | Attending: Psychiatry | Admitting: Psychiatry
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
## 2020-06-24 ENCOUNTER — Telehealth (HOSPITAL_COMMUNITY): Payer: Self-pay | Admitting: Licensed Clinical Social Worker

## 2020-07-29 ENCOUNTER — Other Ambulatory Visit: Payer: Self-pay

## 2020-07-29 ENCOUNTER — Ambulatory Visit: Payer: Self-pay | Admitting: *Deleted

## 2020-07-29 VITALS — BP 136/87 | HR 80 | Ht 73.0 in | Wt 246.0 lb

## 2020-07-29 DIAGNOSIS — Z0000 Encounter for general adult medical examination without abnormal findings: Secondary | ICD-10-CM

## 2020-07-29 NOTE — Progress Notes (Signed)
[employer] insurance premium discount evaluation: Labs Drawn. [employer] ROI form signed. Tobacco Free Attestation form signed.  Forms placed in paper chart.  Will route results to Dr. Clelia Croft upon receipt.

## 2020-07-30 LAB — CMP12+LP+TP+TSH+6AC+CBC/D/PLT
ALT: 14 IU/L (ref 0–44)
AST: 12 IU/L (ref 0–40)
Albumin/Globulin Ratio: 1.4 (ref 1.2–2.2)
Albumin: 4 g/dL — ABNORMAL LOW (ref 4.1–5.2)
Alkaline Phosphatase: 70 IU/L (ref 44–121)
BUN/Creatinine Ratio: 9 (ref 9–20)
BUN: 6 mg/dL (ref 6–20)
Basophils Absolute: 0.1 10*3/uL (ref 0.0–0.2)
Basos: 1 %
Bilirubin Total: 0.2 mg/dL (ref 0.0–1.2)
Calcium: 9 mg/dL (ref 8.7–10.2)
Chloride: 104 mmol/L (ref 96–106)
Chol/HDL Ratio: 3.4 ratio (ref 0.0–5.0)
Cholesterol, Total: 140 mg/dL (ref 100–199)
Creatinine, Ser: 0.67 mg/dL — ABNORMAL LOW (ref 0.76–1.27)
EOS (ABSOLUTE): 0.3 10*3/uL (ref 0.0–0.4)
Eos: 3 %
Estimated CHD Risk: 0.5 times avg. (ref 0.0–1.0)
Free Thyroxine Index: 1.8 (ref 1.2–4.9)
GGT: 11 IU/L (ref 0–65)
Globulin, Total: 2.8 g/dL (ref 1.5–4.5)
Glucose: 82 mg/dL (ref 65–99)
HDL: 41 mg/dL (ref >39)
Hematocrit: 42.3 % (ref 37.5–51.0)
Hemoglobin: 14.6 g/dL (ref 13.0–17.7)
Immature Grans (Abs): 0 10*3/uL (ref 0.0–0.1)
Immature Granulocytes: 0 %
Iron: 62 ug/dL (ref 38–169)
LDH: 139 IU/L (ref 121–224)
LDL Chol Calc (NIH): 65 mg/dL (ref 0–99)
Lymphocytes Absolute: 2.2 10*3/uL (ref 0.7–3.1)
Lymphs: 27 %
MCH: 31.4 pg (ref 26.6–33.0)
MCHC: 34.5 g/dL (ref 31.5–35.7)
MCV: 91 fL (ref 79–97)
Monocytes Absolute: 0.8 10*3/uL (ref 0.1–0.9)
Monocytes: 10 %
Neutrophils Absolute: 5 10*3/uL (ref 1.4–7.0)
Neutrophils: 59 %
Phosphorus: 3.4 mg/dL (ref 2.8–4.1)
Platelets: 244 10*3/uL (ref 150–450)
Potassium: 4.1 mmol/L (ref 3.5–5.2)
RBC: 4.65 x10E6/uL (ref 4.14–5.80)
RDW: 12.6 % (ref 11.6–15.4)
Sodium: 139 mmol/L (ref 134–144)
T3 Uptake Ratio: 25 % (ref 24–39)
T4, Total: 7.1 ug/dL (ref 4.5–12.0)
TSH: 1.3 u[IU]/mL (ref 0.450–4.500)
Total Protein: 6.8 g/dL (ref 6.0–8.5)
Triglycerides: 206 mg/dL — ABNORMAL HIGH (ref 0–149)
Uric Acid: 6.1 mg/dL (ref 3.8–8.4)
VLDL Cholesterol Cal: 34 mg/dL (ref 5–40)
WBC: 8.4 10*3/uL (ref 3.4–10.8)
eGFR: 135 mL/min/{1.73_m2} (ref >59)

## 2020-07-30 LAB — HGB A1C W/O EAG: Hgb A1c MFr Bld: 5 % (ref 4.8–5.6)

## 2020-08-06 ENCOUNTER — Ambulatory Visit: Payer: Self-pay | Admitting: *Deleted

## 2020-08-06 ENCOUNTER — Other Ambulatory Visit: Payer: Self-pay

## 2020-08-06 DIAGNOSIS — S71139A Puncture wound without foreign body, unspecified thigh, initial encounter: Secondary | ICD-10-CM
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
## 2020-08-13 ENCOUNTER — Encounter: Payer: Self-pay | Admitting: *Deleted

## 2020-08-13 ENCOUNTER — Telehealth: Payer: Self-pay | Admitting: *Deleted

## 2020-08-13 DIAGNOSIS — J029 Acute pharyngitis, unspecified: Secondary | ICD-10-CM

## 2020-08-13 MED ORDER — LORATADINE 10 MG PO TABS: 10.0000 mg | ORAL_TABLET | Freq: Every day | ORAL | 1 refills | Status: AC

## 2020-08-13 MED ORDER — SALINE SPRAY 0.65 % NA SOLN: 2.0000 | NASAL | 0 refills | Status: AC

## 2020-08-13 MED ORDER — FLUTICASONE PROPIONATE 50 MCG/ACT NA SUSP: 1.0000 | Freq: Two times a day (BID) | NASAL | 0 refills | Status: AC

## 2020-08-13 NOTE — Telephone Encounter (Signed)
Pt called RN reporting she called out of work today due to cough, sore throat, and nasal congestion. Denies body aches, fever/chills, n/v/d. Reports feeling like "throat was closed up, my tonsils were so big I couldn't breathe good." Asked pt if any white spots were noticeable on tonsils. She reports that there are. Asked her to email RN a picture since she is not onsite currently. Waiting for that to come through and will reach out to NP for further orders if strep throat appears to be culprit.

## 2020-08-13 NOTE — Telephone Encounter (Signed)
Pt sent additional picture after gargling water as first picture was hard to see. Second picture showed more mucus streaking than spots relative to strep. NP Inetta Fermo agreed. Advised pt to start Zyrtec/Claritin, nasal saline spray, phenylephrine and Flonase. She reports only $8 in bank account and unable to pick these up or have delivered. RN will leave bag of OTC samples in door outside clinic for pt to pick up tomorrow since clinic is closed tomorrow. Reports she is also concerned about walking 15 miles to work tomorrow morning with her current sx and getting out of breath, as she does not have a ride in. Advised she can discuss this with HR as RN does not have any other recommendations as pt is wanting to return to work and does not have current indications for quarantine, so mailing meds would not be feasible. She verbalizes understanding and will follow regular call out procedures if she is unable to make it in to work tomorrow.

## 2020-08-13 NOTE — Telephone Encounter (Signed)
Case discussed earlier today with RN Rolly Salter via telephone and reviewed patient photos. Post nasal drip causing sore throat.  Start flonase, phenylephrine, nasal saline and OTC antihistamine of choice.  Patient up to date with covid vaccination and no known covid contacts.  Wear mask while at work. Notify clinic staff if new or worsening symptoms for re-evaluation.

## 2020-08-14 NOTE — Telephone Encounter (Signed)
Spoke with pt by phone. She reports she was out of work today due to not feeling like she could walk 15 miles from home to work with her nasal congestion and fatigue sx. Reports she ordered an electric scooter that is suppoed to be delivered tomorrow and has already called out of work for [employer]. Cannot get to store for OTCs either. Reminded of steamy shower. Reports she gets to hot with that, even just sitting in a hot bathroom, so suggested boiling pot of water plain or with some lemon, mint, thyme, etc, and hold head over and breathe in steam. She verbalizes understanding. No further questions or concerns.

## 2020-08-15 NOTE — Telephone Encounter (Signed)
Reviewed RN Rolly Salter note patient continues at home due to transportation issues and symptoms.  Agreed with plan of care.  Patient could consider delivery service/amazon/[employer] etc for delivery of needed supplies also.

## 2020-09-10 ENCOUNTER — Telehealth: Payer: Self-pay | Admitting: *Deleted

## 2020-09-10 ENCOUNTER — Encounter: Payer: Self-pay | Admitting: *Deleted

## 2020-09-10 NOTE — Telephone Encounter (Signed)
RN notified by HR that pt called out of work this morning citing upset stomach. Supervisor also noted that her voice was low and scratchy. Spoke with pt by phone. She reports upon waking this morning she was very nauseous. She has not had any vomiting or diarrhea. When questioned about abd pain she sts "not any worse than usual." Sts many things cause an upset stomach for her. Said she may have eaten something that didn't agree with her. Nausea is improving now. Voice sounded scratchy at start of phone call but normalized the more she spoke. Pt denies any URI or allergy sx. Questioned if she had been sleeping when answering the phone. She was lying down and reports voice could sound "rough" due to "exhaustion." Sts she slept all day Saturday and most of day Sunday. Typically sleeping 12-16 hours/day past week. She reports not knowing why she is tired. RN questions if r/t or worsened by her depression. She endorses this as a possibility. Confirms a phone visit with therapist tomorrow. Encouraged to discuss her current sleep with therapist.  Plan for f/u tomorrow 5/4 for further determination for RTW. At this time, not cleared to RTW.

## 2020-09-10 NOTE — Telephone Encounter (Signed)
RN Rolly Salter discussed patient case with me in clinic.  Reviewed note agreed with plan of care.  I am following up with patient tomorrow via telephone for re-evaluation.

## 2020-09-11 NOTE — Telephone Encounter (Signed)
Patient contacted via telephone.  Stated feeling a little better.  Voice back to normal.  Saw PCM today and PCM sending her work excuse until she can get back on her medications date TBD as PCM working with pharmacy and patient to obtain medications.  Patient not too happy to be missing more work days but says it is what it is. Patient had no further questions or concerns at this time.  HR notified personal medical and patient obtaining work note from [facility]  Spoke full sentences without difficulty, no cough/congestion/nasal sniffing or throat clearing noted during 3 minute telephone call.

## 2020-09-25 NOTE — Telephone Encounter (Signed)
HR received work excuse from patient [facility] and patient continues on medical leave.

## 2020-10-17 NOTE — Telephone Encounter (Signed)
HR Eric Herman notified clinic staff he has been unable to reach patient to get update regarding if returning to work in near future.  HR notified PCM has been managing patient care and no further calls to [facility] clinic staff have been received from patient.

## 2020-10-28 NOTE — Telephone Encounter (Signed)
Notified by HR Tim that pt called him last week reporting that she is doing well and gave her resignation from the company. Closing encounter since no longer an employee.

## 2020-10-28 NOTE — Telephone Encounter (Signed)
Noted patient has resigned from employer and no longer eligible for care at [facility].  Patient is established with PCM and mental health provider.
